# Patient Record
Sex: Female | Born: 1950
Health system: Southern US, Community
[De-identification: ages and names within clinical notes are randomized; demographics above are authoritative.]

## PROBLEM LIST (undated history)

## (undated) HISTORY — PX: NOSE SURGERY: SHX723

## (undated) HISTORY — PX: ABDOMINAL HYSTERECTOMY: SHX81

## (undated) HISTORY — PX: CHOLECYSTECTOMY: SHX55

---

## 2001-03-13 ENCOUNTER — Encounter: Payer: Self-pay | Admitting: Family Medicine

## 2001-03-13 ENCOUNTER — Encounter: Admission: RE | Admit: 2001-03-13 | Discharge: 2001-03-13 | Payer: Self-pay | Admitting: *Deleted

## 2007-09-14 ENCOUNTER — Ambulatory Visit: Payer: Self-pay | Admitting: Vascular Surgery

## 2010-10-05 NOTE — Consult Note (Signed)
NEW PATIENT CONSULTATION   Diane Lawrence, Diane Lawrence  DOB:  April 25, 1951                                       09/14/2007  UEAVW#:[id number]   The patient presents today for evaluation of left neck pain.  She is a  pleasant 60 year old white female with a long history of concern  regarding pain in her left neck.  She reports this can be throbbing and  reports a drawing and a twitching sensation in her left neck as well.  She has not had any prior focal neurologic deficits, specifically no  amaurosis fugax, transient ischemic attack, or stroke.  She does report  that, occasionally, she has a drawing sensation in her left hand.  She  was told 10 years ago that she had coronary artery disease and has been  on aspirin and Plavix since that time.  She has had several ultrasound  evaluations of her carotids showing no evidence of stenosis in the past.  She does have a history of stroke and the family is quite concerned  regarding this.  She is concerned that her neck pain may be a sign for  carotid disease and risk for stroke.  She does have a recent history of  cholecystectomy 5 weeks ago and she is recovering from this.  She also  has a history of reflux.   SOCIAL HISTORY:  She is single.  She does not smoke, having quit 1 year  ago.  Does not drink alcohol on a regular basis.  She denies history of  premature atherosclerotic disease in her parents.   REVIEW OF SYSTEMS:  Positive for weight loss, shortness of breath, heart  murmur, bronchitis, asthma, hiatal hernia, diarrhea following her  cholecystectomy, headache, arthritic joint pain, depression, and  nervousness.   ALLERGIES:  Codeine, aspirin, prednisone, and Cipro.   MEDICATIONS:  Plavix, aspirin, NitroQuick, lorazepam as needed, Nexium,  and Xopenex.   PHYSICAL EXAM:  Well-developed, well-nourished white female appearing  stated age of 20.  Blood pressure is 117/77, pulse 70, respirations 16.  She is grossly  intact neurologically.  She has 2+ radial and 2+ dorsalis  pedis pulses bilaterally.  Heart is regular rate and rhythm.  I do not  appreciate a murmur.  Her chest has some mild wheezing on inspiration.  Her carotid arteries are without bruits bilaterally.  I do not detect  any masses or any other abnormality in her right or left neck.  She  underwent repeat carotid duplex today and this confirms no evidence of  carotid stenoses and antegrade vertebral flow bilaterally.  I discussed  this at length with the patient, explaining that we would not anticipate  any neck pain as a sign for carotid disease.  I explained that the main  concern about carotid disease is it would be asymptomatic until there  was a neurologic event.  I reassured her that she does not have any  significant stenoses bilaterally and would not put her at any increased  risk for stroke from a carotid standpoint.  She was reassured with this  discussion and will see [place] again on an as needed basis.   Larina Earthly, M.D.  Electronically Signed   TFE/MEDQ  D:  09/14/2007  T:  09/17/2007  Job:  [id number]   cc:   Dr. Burnett Kanaris

## 2010-10-05 NOTE — Procedures (Signed)
CAROTID DUPLEX EXAM   INDICATION:  Neck pain.   HISTORY:  Diabetes:  No.  Cardiac:  Coronary artery disease, MI years ago per the patient.  Hypertension:  No.  Smoking:  No.  Quit 1 year ago.  Previous Surgery:  No.  CV History:  Amaurosis Fugax No, Paresthesias No, Hemiparesis No  The patient complains of several episodes of the 3 fingers on her left  hand drawing.                                       RIGHT             LEFT  Brachial systolic pressure:         110               100  Brachial Doppler waveforms:         Biphasic          Biphasic  Vertebral direction of flow:        Antegrade         Antegrade  DUPLEX VELOCITIES (cm/sec)  CCA peak systolic                   102               113  ECA peak systolic                   96                99  ICA peak systolic                   83                102  ICA end diastolic                   26                43  PLAQUE MORPHOLOGY:                  None              None  PLAQUE AMOUNT:                      None              None  PLAQUE LOCATION:                    None              None   IMPRESSION:  No evidence of ICA stenosis bilaterally.   ___________________________________________  Larina Earthly, M.D.   DP/MEDQ  D:  09/14/2007  T:  09/14/2007  Job:  [id number]

## 2011-05-31 DIAGNOSIS — S239XXA Sprain of unspecified parts of thorax, initial encounter: Secondary | ICD-10-CM | POA: Diagnosis not present

## 2011-06-01 DIAGNOSIS — R1084 Generalized abdominal pain: Secondary | ICD-10-CM | POA: Diagnosis not present

## 2011-06-01 DIAGNOSIS — R109 Unspecified abdominal pain: Secondary | ICD-10-CM | POA: Diagnosis not present

## 2011-06-03 DIAGNOSIS — R109 Unspecified abdominal pain: Secondary | ICD-10-CM | POA: Diagnosis not present

## 2011-06-06 DIAGNOSIS — M19019 Primary osteoarthritis, unspecified shoulder: Secondary | ICD-10-CM | POA: Diagnosis not present

## 2011-06-06 DIAGNOSIS — IMO0001 Reserved for inherently not codable concepts without codable children: Secondary | ICD-10-CM | POA: Diagnosis not present

## 2011-06-06 DIAGNOSIS — F411 Generalized anxiety disorder: Secondary | ICD-10-CM | POA: Diagnosis not present

## 2011-06-08 DIAGNOSIS — R079 Chest pain, unspecified: Secondary | ICD-10-CM | POA: Diagnosis not present

## 2011-06-08 DIAGNOSIS — I10 Essential (primary) hypertension: Secondary | ICD-10-CM | POA: Diagnosis not present

## 2011-06-08 DIAGNOSIS — R0602 Shortness of breath: Secondary | ICD-10-CM | POA: Diagnosis not present

## 2011-06-08 DIAGNOSIS — R002 Palpitations: Secondary | ICD-10-CM | POA: Diagnosis not present

## 2011-06-13 DIAGNOSIS — K219 Gastro-esophageal reflux disease without esophagitis: Secondary | ICD-10-CM | POA: Diagnosis not present

## 2011-06-13 DIAGNOSIS — R002 Palpitations: Secondary | ICD-10-CM | POA: Diagnosis not present

## 2011-06-21 DIAGNOSIS — R002 Palpitations: Secondary | ICD-10-CM | POA: Diagnosis not present

## 2011-06-23 DIAGNOSIS — K589 Irritable bowel syndrome without diarrhea: Secondary | ICD-10-CM | POA: Diagnosis not present

## 2011-07-06 DIAGNOSIS — D1739 Benign lipomatous neoplasm of skin and subcutaneous tissue of other sites: Secondary | ICD-10-CM | POA: Diagnosis not present

## 2011-07-07 DIAGNOSIS — J309 Allergic rhinitis, unspecified: Secondary | ICD-10-CM | POA: Diagnosis not present

## 2011-07-07 DIAGNOSIS — F411 Generalized anxiety disorder: Secondary | ICD-10-CM | POA: Diagnosis not present

## 2011-07-07 DIAGNOSIS — G4730 Sleep apnea, unspecified: Secondary | ICD-10-CM | POA: Diagnosis not present

## 2011-07-07 DIAGNOSIS — R0609 Other forms of dyspnea: Secondary | ICD-10-CM | POA: Diagnosis not present

## 2011-07-08 DIAGNOSIS — K7689 Other specified diseases of liver: Secondary | ICD-10-CM | POA: Diagnosis not present

## 2011-07-08 DIAGNOSIS — R1011 Right upper quadrant pain: Secondary | ICD-10-CM | POA: Diagnosis not present

## 2011-07-08 DIAGNOSIS — N289 Disorder of kidney and ureter, unspecified: Secondary | ICD-10-CM | POA: Diagnosis not present

## 2011-07-08 DIAGNOSIS — R1032 Left lower quadrant pain: Secondary | ICD-10-CM | POA: Diagnosis not present

## 2011-07-21 DIAGNOSIS — R002 Palpitations: Secondary | ICD-10-CM | POA: Diagnosis not present

## 2011-08-12 DIAGNOSIS — Z86010 Personal history of colonic polyps: Secondary | ICD-10-CM | POA: Diagnosis not present

## 2011-08-12 DIAGNOSIS — K921 Melena: Secondary | ICD-10-CM | POA: Diagnosis not present

## 2011-09-06 DIAGNOSIS — K921 Melena: Secondary | ICD-10-CM | POA: Diagnosis not present

## 2011-09-06 DIAGNOSIS — K644 Residual hemorrhoidal skin tags: Secondary | ICD-10-CM | POA: Diagnosis not present

## 2011-09-20 DIAGNOSIS — I10 Essential (primary) hypertension: Secondary | ICD-10-CM | POA: Diagnosis not present

## 2011-09-20 DIAGNOSIS — R002 Palpitations: Secondary | ICD-10-CM | POA: Diagnosis not present

## 2011-09-22 DIAGNOSIS — K219 Gastro-esophageal reflux disease without esophagitis: Secondary | ICD-10-CM | POA: Diagnosis not present

## 2011-09-22 DIAGNOSIS — K449 Diaphragmatic hernia without obstruction or gangrene: Secondary | ICD-10-CM | POA: Diagnosis not present

## 2011-09-22 DIAGNOSIS — K644 Residual hemorrhoidal skin tags: Secondary | ICD-10-CM | POA: Diagnosis not present

## 2011-09-22 DIAGNOSIS — K589 Irritable bowel syndrome without diarrhea: Secondary | ICD-10-CM | POA: Diagnosis not present

## 2011-09-28 DIAGNOSIS — R7301 Impaired fasting glucose: Secondary | ICD-10-CM | POA: Diagnosis not present

## 2011-09-28 DIAGNOSIS — Z833 Family history of diabetes mellitus: Secondary | ICD-10-CM | POA: Diagnosis not present

## 2011-09-28 DIAGNOSIS — R300 Dysuria: Secondary | ICD-10-CM | POA: Diagnosis not present

## 2011-09-28 DIAGNOSIS — M542 Cervicalgia: Secondary | ICD-10-CM | POA: Diagnosis not present

## 2011-10-05 DIAGNOSIS — J309 Allergic rhinitis, unspecified: Secondary | ICD-10-CM | POA: Diagnosis not present

## 2011-10-05 DIAGNOSIS — R0609 Other forms of dyspnea: Secondary | ICD-10-CM | POA: Diagnosis not present

## 2011-10-05 DIAGNOSIS — R0989 Other specified symptoms and signs involving the circulatory and respiratory systems: Secondary | ICD-10-CM | POA: Diagnosis not present

## 2011-10-05 DIAGNOSIS — G4710 Hypersomnia, unspecified: Secondary | ICD-10-CM | POA: Diagnosis not present

## 2011-10-05 DIAGNOSIS — F411 Generalized anxiety disorder: Secondary | ICD-10-CM | POA: Diagnosis not present

## 2011-10-05 DIAGNOSIS — G4730 Sleep apnea, unspecified: Secondary | ICD-10-CM | POA: Diagnosis not present

## 2011-10-07 DIAGNOSIS — K589 Irritable bowel syndrome without diarrhea: Secondary | ICD-10-CM | POA: Diagnosis not present

## 2011-10-07 DIAGNOSIS — F411 Generalized anxiety disorder: Secondary | ICD-10-CM | POA: Diagnosis not present

## 2011-10-07 DIAGNOSIS — I10 Essential (primary) hypertension: Secondary | ICD-10-CM | POA: Diagnosis not present

## 2011-10-07 DIAGNOSIS — G4710 Hypersomnia, unspecified: Secondary | ICD-10-CM | POA: Diagnosis not present

## 2011-10-07 DIAGNOSIS — K5730 Diverticulosis of large intestine without perforation or abscess without bleeding: Secondary | ICD-10-CM | POA: Diagnosis not present

## 2011-10-07 DIAGNOSIS — K449 Diaphragmatic hernia without obstruction or gangrene: Secondary | ICD-10-CM | POA: Diagnosis not present

## 2011-10-07 DIAGNOSIS — J45909 Unspecified asthma, uncomplicated: Secondary | ICD-10-CM | POA: Diagnosis not present

## 2011-10-07 DIAGNOSIS — G4730 Sleep apnea, unspecified: Secondary | ICD-10-CM | POA: Diagnosis not present

## 2011-10-07 DIAGNOSIS — K219 Gastro-esophageal reflux disease without esophagitis: Secondary | ICD-10-CM | POA: Diagnosis not present

## 2011-10-07 DIAGNOSIS — M6281 Muscle weakness (generalized): Secondary | ICD-10-CM | POA: Diagnosis not present

## 2011-10-08 DIAGNOSIS — K589 Irritable bowel syndrome without diarrhea: Secondary | ICD-10-CM | POA: Diagnosis not present

## 2011-10-08 DIAGNOSIS — I10 Essential (primary) hypertension: Secondary | ICD-10-CM | POA: Diagnosis not present

## 2011-10-08 DIAGNOSIS — G4710 Hypersomnia, unspecified: Secondary | ICD-10-CM | POA: Diagnosis not present

## 2011-10-08 DIAGNOSIS — K219 Gastro-esophageal reflux disease without esophagitis: Secondary | ICD-10-CM | POA: Diagnosis not present

## 2011-10-08 DIAGNOSIS — J45909 Unspecified asthma, uncomplicated: Secondary | ICD-10-CM | POA: Diagnosis not present

## 2011-10-08 DIAGNOSIS — K5730 Diverticulosis of large intestine without perforation or abscess without bleeding: Secondary | ICD-10-CM | POA: Diagnosis not present

## 2011-10-11 DIAGNOSIS — G4710 Hypersomnia, unspecified: Secondary | ICD-10-CM | POA: Diagnosis not present

## 2011-10-11 DIAGNOSIS — J45909 Unspecified asthma, uncomplicated: Secondary | ICD-10-CM | POA: Diagnosis not present

## 2011-10-11 DIAGNOSIS — K589 Irritable bowel syndrome without diarrhea: Secondary | ICD-10-CM | POA: Diagnosis not present

## 2011-10-11 DIAGNOSIS — I10 Essential (primary) hypertension: Secondary | ICD-10-CM | POA: Diagnosis not present

## 2011-10-11 DIAGNOSIS — K219 Gastro-esophageal reflux disease without esophagitis: Secondary | ICD-10-CM | POA: Diagnosis not present

## 2011-10-11 DIAGNOSIS — K5730 Diverticulosis of large intestine without perforation or abscess without bleeding: Secondary | ICD-10-CM | POA: Diagnosis not present

## 2011-10-13 DIAGNOSIS — K219 Gastro-esophageal reflux disease without esophagitis: Secondary | ICD-10-CM | POA: Diagnosis not present

## 2011-10-13 DIAGNOSIS — K589 Irritable bowel syndrome without diarrhea: Secondary | ICD-10-CM | POA: Diagnosis not present

## 2011-10-13 DIAGNOSIS — K5730 Diverticulosis of large intestine without perforation or abscess without bleeding: Secondary | ICD-10-CM | POA: Diagnosis not present

## 2011-10-13 DIAGNOSIS — I10 Essential (primary) hypertension: Secondary | ICD-10-CM | POA: Diagnosis not present

## 2011-10-13 DIAGNOSIS — J45909 Unspecified asthma, uncomplicated: Secondary | ICD-10-CM | POA: Diagnosis not present

## 2011-10-13 DIAGNOSIS — G4730 Sleep apnea, unspecified: Secondary | ICD-10-CM | POA: Diagnosis not present

## 2011-10-19 DIAGNOSIS — G4730 Sleep apnea, unspecified: Secondary | ICD-10-CM | POA: Diagnosis not present

## 2011-10-19 DIAGNOSIS — J45909 Unspecified asthma, uncomplicated: Secondary | ICD-10-CM | POA: Diagnosis not present

## 2011-10-19 DIAGNOSIS — K589 Irritable bowel syndrome without diarrhea: Secondary | ICD-10-CM | POA: Diagnosis not present

## 2011-10-19 DIAGNOSIS — I10 Essential (primary) hypertension: Secondary | ICD-10-CM | POA: Diagnosis not present

## 2011-10-19 DIAGNOSIS — K5730 Diverticulosis of large intestine without perforation or abscess without bleeding: Secondary | ICD-10-CM | POA: Diagnosis not present

## 2011-10-19 DIAGNOSIS — K219 Gastro-esophageal reflux disease without esophagitis: Secondary | ICD-10-CM | POA: Diagnosis not present

## 2011-10-26 DIAGNOSIS — I10 Essential (primary) hypertension: Secondary | ICD-10-CM | POA: Diagnosis not present

## 2011-10-26 DIAGNOSIS — J449 Chronic obstructive pulmonary disease, unspecified: Secondary | ICD-10-CM | POA: Diagnosis not present

## 2011-10-26 DIAGNOSIS — S81009A Unspecified open wound, unspecified knee, initial encounter: Secondary | ICD-10-CM | POA: Diagnosis not present

## 2011-10-26 DIAGNOSIS — R109 Unspecified abdominal pain: Secondary | ICD-10-CM | POA: Diagnosis not present

## 2011-10-26 DIAGNOSIS — M542 Cervicalgia: Secondary | ICD-10-CM | POA: Diagnosis not present

## 2011-10-26 DIAGNOSIS — S91009A Unspecified open wound, unspecified ankle, initial encounter: Secondary | ICD-10-CM | POA: Diagnosis not present

## 2011-10-27 DIAGNOSIS — K5730 Diverticulosis of large intestine without perforation or abscess without bleeding: Secondary | ICD-10-CM | POA: Diagnosis not present

## 2011-10-27 DIAGNOSIS — K219 Gastro-esophageal reflux disease without esophagitis: Secondary | ICD-10-CM | POA: Diagnosis not present

## 2011-10-27 DIAGNOSIS — J45909 Unspecified asthma, uncomplicated: Secondary | ICD-10-CM | POA: Diagnosis not present

## 2011-10-27 DIAGNOSIS — K589 Irritable bowel syndrome without diarrhea: Secondary | ICD-10-CM | POA: Diagnosis not present

## 2011-10-27 DIAGNOSIS — I10 Essential (primary) hypertension: Secondary | ICD-10-CM | POA: Diagnosis not present

## 2011-10-27 DIAGNOSIS — G4710 Hypersomnia, unspecified: Secondary | ICD-10-CM | POA: Diagnosis not present

## 2011-10-28 DIAGNOSIS — J209 Acute bronchitis, unspecified: Secondary | ICD-10-CM | POA: Diagnosis not present

## 2011-10-28 DIAGNOSIS — J019 Acute sinusitis, unspecified: Secondary | ICD-10-CM | POA: Diagnosis not present

## 2011-10-28 DIAGNOSIS — J029 Acute pharyngitis, unspecified: Secondary | ICD-10-CM | POA: Diagnosis not present

## 2011-11-01 DIAGNOSIS — G4730 Sleep apnea, unspecified: Secondary | ICD-10-CM | POA: Diagnosis not present

## 2011-11-01 DIAGNOSIS — G4710 Hypersomnia, unspecified: Secondary | ICD-10-CM | POA: Diagnosis not present

## 2011-11-01 DIAGNOSIS — R0989 Other specified symptoms and signs involving the circulatory and respiratory systems: Secondary | ICD-10-CM | POA: Diagnosis not present

## 2011-11-01 DIAGNOSIS — R0609 Other forms of dyspnea: Secondary | ICD-10-CM | POA: Diagnosis not present

## 2011-11-01 DIAGNOSIS — F411 Generalized anxiety disorder: Secondary | ICD-10-CM | POA: Diagnosis not present

## 2011-11-02 DIAGNOSIS — K589 Irritable bowel syndrome without diarrhea: Secondary | ICD-10-CM | POA: Diagnosis not present

## 2011-11-02 DIAGNOSIS — K5730 Diverticulosis of large intestine without perforation or abscess without bleeding: Secondary | ICD-10-CM | POA: Diagnosis not present

## 2011-11-02 DIAGNOSIS — I10 Essential (primary) hypertension: Secondary | ICD-10-CM | POA: Diagnosis not present

## 2011-11-02 DIAGNOSIS — J45909 Unspecified asthma, uncomplicated: Secondary | ICD-10-CM | POA: Diagnosis not present

## 2011-11-02 DIAGNOSIS — K219 Gastro-esophageal reflux disease without esophagitis: Secondary | ICD-10-CM | POA: Diagnosis not present

## 2011-11-02 DIAGNOSIS — G4710 Hypersomnia, unspecified: Secondary | ICD-10-CM | POA: Diagnosis not present

## 2011-11-07 DIAGNOSIS — M25569 Pain in unspecified knee: Secondary | ICD-10-CM | POA: Diagnosis not present

## 2011-11-07 DIAGNOSIS — M25539 Pain in unspecified wrist: Secondary | ICD-10-CM | POA: Diagnosis not present

## 2011-11-07 DIAGNOSIS — W1809XA Striking against other object with subsequent fall, initial encounter: Secondary | ICD-10-CM | POA: Diagnosis not present

## 2011-11-07 DIAGNOSIS — R51 Headache: Secondary | ICD-10-CM | POA: Diagnosis not present

## 2011-11-09 DIAGNOSIS — F411 Generalized anxiety disorder: Secondary | ICD-10-CM | POA: Diagnosis not present

## 2011-11-09 DIAGNOSIS — R599 Enlarged lymph nodes, unspecified: Secondary | ICD-10-CM | POA: Diagnosis not present

## 2011-11-09 DIAGNOSIS — R0989 Other specified symptoms and signs involving the circulatory and respiratory systems: Secondary | ICD-10-CM | POA: Diagnosis not present

## 2011-11-09 DIAGNOSIS — R0609 Other forms of dyspnea: Secondary | ICD-10-CM | POA: Diagnosis not present

## 2011-11-09 DIAGNOSIS — G4730 Sleep apnea, unspecified: Secondary | ICD-10-CM | POA: Diagnosis not present

## 2011-11-09 DIAGNOSIS — G4710 Hypersomnia, unspecified: Secondary | ICD-10-CM | POA: Diagnosis not present

## 2011-11-10 DIAGNOSIS — K5730 Diverticulosis of large intestine without perforation or abscess without bleeding: Secondary | ICD-10-CM | POA: Diagnosis not present

## 2011-11-10 DIAGNOSIS — I10 Essential (primary) hypertension: Secondary | ICD-10-CM | POA: Diagnosis not present

## 2011-11-10 DIAGNOSIS — K589 Irritable bowel syndrome without diarrhea: Secondary | ICD-10-CM | POA: Diagnosis not present

## 2011-11-10 DIAGNOSIS — K219 Gastro-esophageal reflux disease without esophagitis: Secondary | ICD-10-CM | POA: Diagnosis not present

## 2011-11-10 DIAGNOSIS — G4730 Sleep apnea, unspecified: Secondary | ICD-10-CM | POA: Diagnosis not present

## 2011-11-10 DIAGNOSIS — J45909 Unspecified asthma, uncomplicated: Secondary | ICD-10-CM | POA: Diagnosis not present

## 2011-11-14 DIAGNOSIS — G4710 Hypersomnia, unspecified: Secondary | ICD-10-CM | POA: Diagnosis not present

## 2011-11-14 DIAGNOSIS — G4730 Sleep apnea, unspecified: Secondary | ICD-10-CM | POA: Diagnosis not present

## 2011-11-15 DIAGNOSIS — I10 Essential (primary) hypertension: Secondary | ICD-10-CM | POA: Diagnosis not present

## 2011-11-15 DIAGNOSIS — G4730 Sleep apnea, unspecified: Secondary | ICD-10-CM | POA: Diagnosis not present

## 2011-11-16 DIAGNOSIS — K589 Irritable bowel syndrome without diarrhea: Secondary | ICD-10-CM | POA: Diagnosis not present

## 2011-11-16 DIAGNOSIS — J45909 Unspecified asthma, uncomplicated: Secondary | ICD-10-CM | POA: Diagnosis not present

## 2011-11-16 DIAGNOSIS — I10 Essential (primary) hypertension: Secondary | ICD-10-CM | POA: Diagnosis not present

## 2011-11-16 DIAGNOSIS — G4710 Hypersomnia, unspecified: Secondary | ICD-10-CM | POA: Diagnosis not present

## 2011-11-16 DIAGNOSIS — K5730 Diverticulosis of large intestine without perforation or abscess without bleeding: Secondary | ICD-10-CM | POA: Diagnosis not present

## 2011-11-16 DIAGNOSIS — K219 Gastro-esophageal reflux disease without esophagitis: Secondary | ICD-10-CM | POA: Diagnosis not present

## 2011-11-21 DIAGNOSIS — K219 Gastro-esophageal reflux disease without esophagitis: Secondary | ICD-10-CM | POA: Diagnosis not present

## 2011-11-21 DIAGNOSIS — G4730 Sleep apnea, unspecified: Secondary | ICD-10-CM | POA: Diagnosis not present

## 2011-11-21 DIAGNOSIS — I10 Essential (primary) hypertension: Secondary | ICD-10-CM | POA: Diagnosis not present

## 2011-11-21 DIAGNOSIS — K589 Irritable bowel syndrome without diarrhea: Secondary | ICD-10-CM | POA: Diagnosis not present

## 2011-11-21 DIAGNOSIS — K5730 Diverticulosis of large intestine without perforation or abscess without bleeding: Secondary | ICD-10-CM | POA: Diagnosis not present

## 2011-11-21 DIAGNOSIS — J45909 Unspecified asthma, uncomplicated: Secondary | ICD-10-CM | POA: Diagnosis not present

## 2011-11-28 DIAGNOSIS — D1739 Benign lipomatous neoplasm of skin and subcutaneous tissue of other sites: Secondary | ICD-10-CM | POA: Diagnosis not present

## 2011-11-30 DIAGNOSIS — R229 Localized swelling, mass and lump, unspecified: Secondary | ICD-10-CM | POA: Diagnosis not present

## 2011-11-30 DIAGNOSIS — D1739 Benign lipomatous neoplasm of skin and subcutaneous tissue of other sites: Secondary | ICD-10-CM | POA: Diagnosis not present

## 2011-12-01 DIAGNOSIS — G4710 Hypersomnia, unspecified: Secondary | ICD-10-CM | POA: Diagnosis not present

## 2011-12-01 DIAGNOSIS — J45909 Unspecified asthma, uncomplicated: Secondary | ICD-10-CM | POA: Diagnosis not present

## 2011-12-01 DIAGNOSIS — K219 Gastro-esophageal reflux disease without esophagitis: Secondary | ICD-10-CM | POA: Diagnosis not present

## 2011-12-01 DIAGNOSIS — K5730 Diverticulosis of large intestine without perforation or abscess without bleeding: Secondary | ICD-10-CM | POA: Diagnosis not present

## 2011-12-01 DIAGNOSIS — K589 Irritable bowel syndrome without diarrhea: Secondary | ICD-10-CM | POA: Diagnosis not present

## 2011-12-01 DIAGNOSIS — I10 Essential (primary) hypertension: Secondary | ICD-10-CM | POA: Diagnosis not present

## 2011-12-05 DIAGNOSIS — D1739 Benign lipomatous neoplasm of skin and subcutaneous tissue of other sites: Secondary | ICD-10-CM | POA: Diagnosis not present

## 2011-12-06 DIAGNOSIS — J45909 Unspecified asthma, uncomplicated: Secondary | ICD-10-CM | POA: Diagnosis not present

## 2011-12-06 DIAGNOSIS — I10 Essential (primary) hypertension: Secondary | ICD-10-CM | POA: Diagnosis not present

## 2011-12-06 DIAGNOSIS — F411 Generalized anxiety disorder: Secondary | ICD-10-CM | POA: Diagnosis not present

## 2011-12-06 DIAGNOSIS — K219 Gastro-esophageal reflux disease without esophagitis: Secondary | ICD-10-CM | POA: Diagnosis not present

## 2011-12-06 DIAGNOSIS — Z9981 Dependence on supplemental oxygen: Secondary | ICD-10-CM | POA: Diagnosis not present

## 2011-12-06 DIAGNOSIS — K589 Irritable bowel syndrome without diarrhea: Secondary | ICD-10-CM | POA: Diagnosis not present

## 2011-12-06 DIAGNOSIS — M2550 Pain in unspecified joint: Secondary | ICD-10-CM | POA: Diagnosis not present

## 2011-12-06 DIAGNOSIS — G4730 Sleep apnea, unspecified: Secondary | ICD-10-CM | POA: Diagnosis not present

## 2011-12-06 DIAGNOSIS — K5730 Diverticulosis of large intestine without perforation or abscess without bleeding: Secondary | ICD-10-CM | POA: Diagnosis not present

## 2011-12-08 DIAGNOSIS — I10 Essential (primary) hypertension: Secondary | ICD-10-CM | POA: Diagnosis not present

## 2011-12-08 DIAGNOSIS — K5730 Diverticulosis of large intestine without perforation or abscess without bleeding: Secondary | ICD-10-CM | POA: Diagnosis not present

## 2011-12-08 DIAGNOSIS — K219 Gastro-esophageal reflux disease without esophagitis: Secondary | ICD-10-CM | POA: Diagnosis not present

## 2011-12-08 DIAGNOSIS — J45909 Unspecified asthma, uncomplicated: Secondary | ICD-10-CM | POA: Diagnosis not present

## 2011-12-08 DIAGNOSIS — K589 Irritable bowel syndrome without diarrhea: Secondary | ICD-10-CM | POA: Diagnosis not present

## 2011-12-08 DIAGNOSIS — M2550 Pain in unspecified joint: Secondary | ICD-10-CM | POA: Diagnosis not present

## 2011-12-09 DIAGNOSIS — R0609 Other forms of dyspnea: Secondary | ICD-10-CM | POA: Diagnosis not present

## 2011-12-09 DIAGNOSIS — G2581 Restless legs syndrome: Secondary | ICD-10-CM | POA: Diagnosis not present

## 2011-12-09 DIAGNOSIS — G4730 Sleep apnea, unspecified: Secondary | ICD-10-CM | POA: Diagnosis not present

## 2011-12-09 DIAGNOSIS — R0989 Other specified symptoms and signs involving the circulatory and respiratory systems: Secondary | ICD-10-CM | POA: Diagnosis not present

## 2011-12-09 DIAGNOSIS — F411 Generalized anxiety disorder: Secondary | ICD-10-CM | POA: Diagnosis not present

## 2011-12-09 DIAGNOSIS — G4710 Hypersomnia, unspecified: Secondary | ICD-10-CM | POA: Diagnosis not present

## 2011-12-12 DIAGNOSIS — G4710 Hypersomnia, unspecified: Secondary | ICD-10-CM | POA: Diagnosis not present

## 2011-12-12 DIAGNOSIS — G4730 Sleep apnea, unspecified: Secondary | ICD-10-CM | POA: Diagnosis not present

## 2011-12-14 DIAGNOSIS — M2550 Pain in unspecified joint: Secondary | ICD-10-CM | POA: Diagnosis not present

## 2011-12-14 DIAGNOSIS — K5730 Diverticulosis of large intestine without perforation or abscess without bleeding: Secondary | ICD-10-CM | POA: Diagnosis not present

## 2011-12-14 DIAGNOSIS — K219 Gastro-esophageal reflux disease without esophagitis: Secondary | ICD-10-CM | POA: Diagnosis not present

## 2011-12-14 DIAGNOSIS — K589 Irritable bowel syndrome without diarrhea: Secondary | ICD-10-CM | POA: Diagnosis not present

## 2011-12-14 DIAGNOSIS — I10 Essential (primary) hypertension: Secondary | ICD-10-CM | POA: Diagnosis not present

## 2011-12-14 DIAGNOSIS — J45909 Unspecified asthma, uncomplicated: Secondary | ICD-10-CM | POA: Diagnosis not present

## 2011-12-15 DIAGNOSIS — K5730 Diverticulosis of large intestine without perforation or abscess without bleeding: Secondary | ICD-10-CM | POA: Diagnosis not present

## 2011-12-21 DIAGNOSIS — K5730 Diverticulosis of large intestine without perforation or abscess without bleeding: Secondary | ICD-10-CM | POA: Diagnosis not present

## 2011-12-21 DIAGNOSIS — K589 Irritable bowel syndrome without diarrhea: Secondary | ICD-10-CM | POA: Diagnosis not present

## 2011-12-21 DIAGNOSIS — M2550 Pain in unspecified joint: Secondary | ICD-10-CM | POA: Diagnosis not present

## 2011-12-21 DIAGNOSIS — I10 Essential (primary) hypertension: Secondary | ICD-10-CM | POA: Diagnosis not present

## 2011-12-21 DIAGNOSIS — J45909 Unspecified asthma, uncomplicated: Secondary | ICD-10-CM | POA: Diagnosis not present

## 2011-12-21 DIAGNOSIS — K219 Gastro-esophageal reflux disease without esophagitis: Secondary | ICD-10-CM | POA: Diagnosis not present

## 2011-12-26 DIAGNOSIS — K219 Gastro-esophageal reflux disease without esophagitis: Secondary | ICD-10-CM | POA: Diagnosis not present

## 2011-12-26 DIAGNOSIS — K5730 Diverticulosis of large intestine without perforation or abscess without bleeding: Secondary | ICD-10-CM | POA: Diagnosis not present

## 2011-12-26 DIAGNOSIS — I10 Essential (primary) hypertension: Secondary | ICD-10-CM | POA: Diagnosis not present

## 2011-12-26 DIAGNOSIS — M2550 Pain in unspecified joint: Secondary | ICD-10-CM | POA: Diagnosis not present

## 2011-12-26 DIAGNOSIS — K589 Irritable bowel syndrome without diarrhea: Secondary | ICD-10-CM | POA: Diagnosis not present

## 2011-12-26 DIAGNOSIS — J45909 Unspecified asthma, uncomplicated: Secondary | ICD-10-CM | POA: Diagnosis not present

## 2011-12-27 DIAGNOSIS — K589 Irritable bowel syndrome without diarrhea: Secondary | ICD-10-CM | POA: Diagnosis not present

## 2011-12-27 DIAGNOSIS — G894 Chronic pain syndrome: Secondary | ICD-10-CM | POA: Diagnosis not present

## 2011-12-27 DIAGNOSIS — G4730 Sleep apnea, unspecified: Secondary | ICD-10-CM | POA: Diagnosis not present

## 2011-12-30 DIAGNOSIS — Z136 Encounter for screening for cardiovascular disorders: Secondary | ICD-10-CM | POA: Diagnosis not present

## 2012-01-02 DIAGNOSIS — M2550 Pain in unspecified joint: Secondary | ICD-10-CM | POA: Diagnosis not present

## 2012-01-02 DIAGNOSIS — J45909 Unspecified asthma, uncomplicated: Secondary | ICD-10-CM | POA: Diagnosis not present

## 2012-01-02 DIAGNOSIS — I10 Essential (primary) hypertension: Secondary | ICD-10-CM | POA: Diagnosis not present

## 2012-01-02 DIAGNOSIS — K219 Gastro-esophageal reflux disease without esophagitis: Secondary | ICD-10-CM | POA: Diagnosis not present

## 2012-01-02 DIAGNOSIS — K5730 Diverticulosis of large intestine without perforation or abscess without bleeding: Secondary | ICD-10-CM | POA: Diagnosis not present

## 2012-01-02 DIAGNOSIS — K589 Irritable bowel syndrome without diarrhea: Secondary | ICD-10-CM | POA: Diagnosis not present

## 2012-01-09 DIAGNOSIS — G4730 Sleep apnea, unspecified: Secondary | ICD-10-CM | POA: Diagnosis not present

## 2012-01-11 DIAGNOSIS — K589 Irritable bowel syndrome without diarrhea: Secondary | ICD-10-CM | POA: Diagnosis not present

## 2012-01-11 DIAGNOSIS — I10 Essential (primary) hypertension: Secondary | ICD-10-CM | POA: Diagnosis not present

## 2012-01-11 DIAGNOSIS — K219 Gastro-esophageal reflux disease without esophagitis: Secondary | ICD-10-CM | POA: Diagnosis not present

## 2012-01-11 DIAGNOSIS — K5730 Diverticulosis of large intestine without perforation or abscess without bleeding: Secondary | ICD-10-CM | POA: Diagnosis not present

## 2012-01-11 DIAGNOSIS — J45909 Unspecified asthma, uncomplicated: Secondary | ICD-10-CM | POA: Diagnosis not present

## 2012-01-11 DIAGNOSIS — M2550 Pain in unspecified joint: Secondary | ICD-10-CM | POA: Diagnosis not present

## 2012-01-16 DIAGNOSIS — G4730 Sleep apnea, unspecified: Secondary | ICD-10-CM | POA: Diagnosis not present

## 2012-01-16 DIAGNOSIS — R0609 Other forms of dyspnea: Secondary | ICD-10-CM | POA: Diagnosis not present

## 2012-01-16 DIAGNOSIS — F411 Generalized anxiety disorder: Secondary | ICD-10-CM | POA: Diagnosis not present

## 2012-01-16 DIAGNOSIS — G2581 Restless legs syndrome: Secondary | ICD-10-CM | POA: Diagnosis not present

## 2012-01-16 DIAGNOSIS — R0989 Other specified symptoms and signs involving the circulatory and respiratory systems: Secondary | ICD-10-CM | POA: Diagnosis not present

## 2012-01-16 DIAGNOSIS — G4710 Hypersomnia, unspecified: Secondary | ICD-10-CM | POA: Diagnosis not present

## 2012-01-18 DIAGNOSIS — J45909 Unspecified asthma, uncomplicated: Secondary | ICD-10-CM | POA: Diagnosis not present

## 2012-01-18 DIAGNOSIS — K5730 Diverticulosis of large intestine without perforation or abscess without bleeding: Secondary | ICD-10-CM | POA: Diagnosis not present

## 2012-01-18 DIAGNOSIS — K219 Gastro-esophageal reflux disease without esophagitis: Secondary | ICD-10-CM | POA: Diagnosis not present

## 2012-01-18 DIAGNOSIS — K589 Irritable bowel syndrome without diarrhea: Secondary | ICD-10-CM | POA: Diagnosis not present

## 2012-01-18 DIAGNOSIS — M2550 Pain in unspecified joint: Secondary | ICD-10-CM | POA: Diagnosis not present

## 2012-01-18 DIAGNOSIS — I10 Essential (primary) hypertension: Secondary | ICD-10-CM | POA: Diagnosis not present

## 2012-01-25 DIAGNOSIS — M2550 Pain in unspecified joint: Secondary | ICD-10-CM | POA: Diagnosis not present

## 2012-01-25 DIAGNOSIS — K219 Gastro-esophageal reflux disease without esophagitis: Secondary | ICD-10-CM | POA: Diagnosis not present

## 2012-01-25 DIAGNOSIS — K589 Irritable bowel syndrome without diarrhea: Secondary | ICD-10-CM | POA: Diagnosis not present

## 2012-01-25 DIAGNOSIS — I10 Essential (primary) hypertension: Secondary | ICD-10-CM | POA: Diagnosis not present

## 2012-01-25 DIAGNOSIS — J45909 Unspecified asthma, uncomplicated: Secondary | ICD-10-CM | POA: Diagnosis not present

## 2012-01-25 DIAGNOSIS — K5730 Diverticulosis of large intestine without perforation or abscess without bleeding: Secondary | ICD-10-CM | POA: Diagnosis not present

## 2012-01-30 DIAGNOSIS — I10 Essential (primary) hypertension: Secondary | ICD-10-CM | POA: Diagnosis not present

## 2012-01-30 DIAGNOSIS — J45909 Unspecified asthma, uncomplicated: Secondary | ICD-10-CM | POA: Diagnosis not present

## 2012-01-30 DIAGNOSIS — K5730 Diverticulosis of large intestine without perforation or abscess without bleeding: Secondary | ICD-10-CM | POA: Diagnosis not present

## 2012-01-30 DIAGNOSIS — M2550 Pain in unspecified joint: Secondary | ICD-10-CM | POA: Diagnosis not present

## 2012-01-30 DIAGNOSIS — K219 Gastro-esophageal reflux disease without esophagitis: Secondary | ICD-10-CM | POA: Diagnosis not present

## 2012-01-30 DIAGNOSIS — K589 Irritable bowel syndrome without diarrhea: Secondary | ICD-10-CM | POA: Diagnosis not present

## 2012-01-31 DIAGNOSIS — R0789 Other chest pain: Secondary | ICD-10-CM | POA: Diagnosis not present

## 2012-01-31 DIAGNOSIS — K469 Unspecified abdominal hernia without obstruction or gangrene: Secondary | ICD-10-CM | POA: Diagnosis not present

## 2012-02-28 DIAGNOSIS — M545 Low back pain: Secondary | ICD-10-CM | POA: Diagnosis not present

## 2012-02-28 DIAGNOSIS — M542 Cervicalgia: Secondary | ICD-10-CM | POA: Diagnosis not present

## 2012-03-01 DIAGNOSIS — M545 Low back pain: Secondary | ICD-10-CM | POA: Diagnosis not present

## 2012-03-01 DIAGNOSIS — M542 Cervicalgia: Secondary | ICD-10-CM | POA: Diagnosis not present

## 2012-03-06 DIAGNOSIS — M545 Low back pain: Secondary | ICD-10-CM | POA: Diagnosis not present

## 2012-03-06 DIAGNOSIS — M542 Cervicalgia: Secondary | ICD-10-CM | POA: Diagnosis not present

## 2012-03-07 DIAGNOSIS — I10 Essential (primary) hypertension: Secondary | ICD-10-CM | POA: Diagnosis not present

## 2012-03-07 DIAGNOSIS — G4730 Sleep apnea, unspecified: Secondary | ICD-10-CM | POA: Diagnosis not present

## 2012-03-07 DIAGNOSIS — R002 Palpitations: Secondary | ICD-10-CM | POA: Diagnosis not present

## 2012-03-13 DIAGNOSIS — M545 Low back pain: Secondary | ICD-10-CM | POA: Diagnosis not present

## 2012-03-13 DIAGNOSIS — M542 Cervicalgia: Secondary | ICD-10-CM | POA: Diagnosis not present

## 2012-03-15 DIAGNOSIS — M542 Cervicalgia: Secondary | ICD-10-CM | POA: Diagnosis not present

## 2012-03-15 DIAGNOSIS — M545 Low back pain: Secondary | ICD-10-CM | POA: Diagnosis not present

## 2012-03-21 DIAGNOSIS — R002 Palpitations: Secondary | ICD-10-CM | POA: Diagnosis not present

## 2012-03-27 DIAGNOSIS — J329 Chronic sinusitis, unspecified: Secondary | ICD-10-CM | POA: Diagnosis not present

## 2012-03-27 DIAGNOSIS — H6690 Otitis media, unspecified, unspecified ear: Secondary | ICD-10-CM | POA: Diagnosis not present

## 2012-03-27 DIAGNOSIS — J029 Acute pharyngitis, unspecified: Secondary | ICD-10-CM | POA: Diagnosis not present

## 2012-04-09 DIAGNOSIS — S0990XA Unspecified injury of head, initial encounter: Secondary | ICD-10-CM | POA: Diagnosis not present

## 2012-04-09 DIAGNOSIS — IMO0002 Reserved for concepts with insufficient information to code with codable children: Secondary | ICD-10-CM | POA: Diagnosis not present

## 2012-04-09 DIAGNOSIS — S1093XA Contusion of unspecified part of neck, initial encounter: Secondary | ICD-10-CM | POA: Diagnosis not present

## 2012-04-09 DIAGNOSIS — S0083XA Contusion of other part of head, initial encounter: Secondary | ICD-10-CM | POA: Diagnosis not present

## 2012-04-09 DIAGNOSIS — S0003XA Contusion of scalp, initial encounter: Secondary | ICD-10-CM | POA: Diagnosis not present

## 2012-04-12 DIAGNOSIS — R5381 Other malaise: Secondary | ICD-10-CM | POA: Diagnosis not present

## 2012-04-12 DIAGNOSIS — R110 Nausea: Secondary | ICD-10-CM | POA: Diagnosis not present

## 2012-04-12 DIAGNOSIS — R109 Unspecified abdominal pain: Secondary | ICD-10-CM | POA: Diagnosis not present

## 2012-04-13 DIAGNOSIS — R197 Diarrhea, unspecified: Secondary | ICD-10-CM | POA: Diagnosis not present

## 2012-04-13 DIAGNOSIS — R109 Unspecified abdominal pain: Secondary | ICD-10-CM | POA: Diagnosis not present

## 2012-04-13 DIAGNOSIS — K5730 Diverticulosis of large intestine without perforation or abscess without bleeding: Secondary | ICD-10-CM | POA: Diagnosis not present

## 2012-04-13 DIAGNOSIS — K769 Liver disease, unspecified: Secondary | ICD-10-CM | POA: Diagnosis not present

## 2012-04-13 DIAGNOSIS — R110 Nausea: Secondary | ICD-10-CM | POA: Diagnosis not present

## 2012-05-02 DIAGNOSIS — J3089 Other allergic rhinitis: Secondary | ICD-10-CM | POA: Diagnosis not present

## 2012-05-02 DIAGNOSIS — G4710 Hypersomnia, unspecified: Secondary | ICD-10-CM | POA: Diagnosis not present

## 2012-05-02 DIAGNOSIS — R0989 Other specified symptoms and signs involving the circulatory and respiratory systems: Secondary | ICD-10-CM | POA: Diagnosis not present

## 2012-05-02 DIAGNOSIS — R0609 Other forms of dyspnea: Secondary | ICD-10-CM | POA: Diagnosis not present

## 2012-05-02 DIAGNOSIS — G2581 Restless legs syndrome: Secondary | ICD-10-CM | POA: Diagnosis not present

## 2012-05-02 DIAGNOSIS — G4730 Sleep apnea, unspecified: Secondary | ICD-10-CM | POA: Diagnosis not present

## 2012-06-25 DIAGNOSIS — G4710 Hypersomnia, unspecified: Secondary | ICD-10-CM | POA: Diagnosis not present

## 2012-06-25 DIAGNOSIS — G2581 Restless legs syndrome: Secondary | ICD-10-CM | POA: Diagnosis not present

## 2012-06-25 DIAGNOSIS — R0609 Other forms of dyspnea: Secondary | ICD-10-CM | POA: Diagnosis not present

## 2012-06-25 DIAGNOSIS — I70219 Atherosclerosis of native arteries of extremities with intermittent claudication, unspecified extremity: Secondary | ICD-10-CM | POA: Diagnosis not present

## 2012-06-25 DIAGNOSIS — G4730 Sleep apnea, unspecified: Secondary | ICD-10-CM | POA: Diagnosis not present

## 2012-07-12 DIAGNOSIS — R51 Headache: Secondary | ICD-10-CM | POA: Diagnosis not present

## 2012-07-30 DIAGNOSIS — J019 Acute sinusitis, unspecified: Secondary | ICD-10-CM | POA: Diagnosis not present

## 2012-08-20 DIAGNOSIS — F411 Generalized anxiety disorder: Secondary | ICD-10-CM | POA: Diagnosis not present

## 2012-08-20 DIAGNOSIS — G4710 Hypersomnia, unspecified: Secondary | ICD-10-CM | POA: Diagnosis not present

## 2012-08-20 DIAGNOSIS — G2581 Restless legs syndrome: Secondary | ICD-10-CM | POA: Diagnosis not present

## 2012-08-20 DIAGNOSIS — R0609 Other forms of dyspnea: Secondary | ICD-10-CM | POA: Diagnosis not present

## 2012-08-20 DIAGNOSIS — R0989 Other specified symptoms and signs involving the circulatory and respiratory systems: Secondary | ICD-10-CM | POA: Diagnosis not present

## 2012-08-20 DIAGNOSIS — I70219 Atherosclerosis of native arteries of extremities with intermittent claudication, unspecified extremity: Secondary | ICD-10-CM | POA: Diagnosis not present

## 2012-08-20 DIAGNOSIS — Z006 Encounter for examination for normal comparison and control in clinical research program: Secondary | ICD-10-CM | POA: Diagnosis not present

## 2012-08-20 DIAGNOSIS — R5381 Other malaise: Secondary | ICD-10-CM | POA: Diagnosis not present

## 2012-09-24 DIAGNOSIS — R0989 Other specified symptoms and signs involving the circulatory and respiratory systems: Secondary | ICD-10-CM | POA: Diagnosis not present

## 2012-09-24 DIAGNOSIS — G4730 Sleep apnea, unspecified: Secondary | ICD-10-CM | POA: Diagnosis not present

## 2012-09-24 DIAGNOSIS — F411 Generalized anxiety disorder: Secondary | ICD-10-CM | POA: Diagnosis not present

## 2012-09-24 DIAGNOSIS — J3089 Other allergic rhinitis: Secondary | ICD-10-CM | POA: Diagnosis not present

## 2012-09-24 DIAGNOSIS — R0609 Other forms of dyspnea: Secondary | ICD-10-CM | POA: Diagnosis not present

## 2012-09-24 DIAGNOSIS — I70219 Atherosclerosis of native arteries of extremities with intermittent claudication, unspecified extremity: Secondary | ICD-10-CM | POA: Diagnosis not present

## 2012-09-24 DIAGNOSIS — G4710 Hypersomnia, unspecified: Secondary | ICD-10-CM | POA: Diagnosis not present

## 2012-09-24 DIAGNOSIS — G2581 Restless legs syndrome: Secondary | ICD-10-CM | POA: Diagnosis not present

## 2012-09-26 DIAGNOSIS — G4730 Sleep apnea, unspecified: Secondary | ICD-10-CM | POA: Diagnosis not present

## 2012-09-26 DIAGNOSIS — R002 Palpitations: Secondary | ICD-10-CM | POA: Diagnosis not present

## 2012-09-26 DIAGNOSIS — R0989 Other specified symptoms and signs involving the circulatory and respiratory systems: Secondary | ICD-10-CM | POA: Diagnosis not present

## 2012-09-26 DIAGNOSIS — I10 Essential (primary) hypertension: Secondary | ICD-10-CM | POA: Diagnosis not present

## 2012-09-28 DIAGNOSIS — G4730 Sleep apnea, unspecified: Secondary | ICD-10-CM | POA: Diagnosis not present

## 2012-09-28 DIAGNOSIS — G4710 Hypersomnia, unspecified: Secondary | ICD-10-CM | POA: Diagnosis not present

## 2012-10-03 DIAGNOSIS — R51 Headache: Secondary | ICD-10-CM | POA: Diagnosis not present

## 2012-10-08 DIAGNOSIS — R0989 Other specified symptoms and signs involving the circulatory and respiratory systems: Secondary | ICD-10-CM | POA: Diagnosis not present

## 2012-11-03 DIAGNOSIS — R42 Dizziness and giddiness: Secondary | ICD-10-CM | POA: Diagnosis not present

## 2012-11-03 DIAGNOSIS — S0990XA Unspecified injury of head, initial encounter: Secondary | ICD-10-CM | POA: Diagnosis not present

## 2012-11-03 DIAGNOSIS — I10 Essential (primary) hypertension: Secondary | ICD-10-CM | POA: Diagnosis not present

## 2012-11-03 DIAGNOSIS — S060X0A Concussion without loss of consciousness, initial encounter: Secondary | ICD-10-CM | POA: Diagnosis not present

## 2012-11-03 DIAGNOSIS — Z8673 Personal history of transient ischemic attack (TIA), and cerebral infarction without residual deficits: Secondary | ICD-10-CM | POA: Diagnosis not present

## 2012-11-03 DIAGNOSIS — W1809XA Striking against other object with subsequent fall, initial encounter: Secondary | ICD-10-CM | POA: Diagnosis not present

## 2012-11-03 DIAGNOSIS — K219 Gastro-esophageal reflux disease without esophagitis: Secondary | ICD-10-CM | POA: Diagnosis not present

## 2012-11-03 DIAGNOSIS — R51 Headache: Secondary | ICD-10-CM | POA: Diagnosis not present

## 2012-11-22 DIAGNOSIS — K219 Gastro-esophageal reflux disease without esophagitis: Secondary | ICD-10-CM | POA: Diagnosis not present

## 2013-01-08 DIAGNOSIS — G4710 Hypersomnia, unspecified: Secondary | ICD-10-CM | POA: Diagnosis not present

## 2013-01-08 DIAGNOSIS — F411 Generalized anxiety disorder: Secondary | ICD-10-CM | POA: Diagnosis not present

## 2013-01-08 DIAGNOSIS — R0609 Other forms of dyspnea: Secondary | ICD-10-CM | POA: Diagnosis not present

## 2013-01-08 DIAGNOSIS — G2581 Restless legs syndrome: Secondary | ICD-10-CM | POA: Diagnosis not present

## 2013-01-08 DIAGNOSIS — J309 Allergic rhinitis, unspecified: Secondary | ICD-10-CM | POA: Diagnosis not present

## 2013-01-09 DIAGNOSIS — G4710 Hypersomnia, unspecified: Secondary | ICD-10-CM | POA: Diagnosis not present

## 2013-03-01 DIAGNOSIS — K219 Gastro-esophageal reflux disease without esophagitis: Secondary | ICD-10-CM | POA: Diagnosis not present

## 2013-03-01 DIAGNOSIS — R079 Chest pain, unspecified: Secondary | ICD-10-CM | POA: Diagnosis not present

## 2013-05-08 DIAGNOSIS — IMO0002 Reserved for concepts with insufficient information to code with codable children: Secondary | ICD-10-CM | POA: Diagnosis not present

## 2013-06-06 DIAGNOSIS — R109 Unspecified abdominal pain: Secondary | ICD-10-CM | POA: Diagnosis not present

## 2013-07-15 DIAGNOSIS — R0609 Other forms of dyspnea: Secondary | ICD-10-CM | POA: Diagnosis not present

## 2013-07-15 DIAGNOSIS — G4710 Hypersomnia, unspecified: Secondary | ICD-10-CM | POA: Diagnosis not present

## 2013-07-15 DIAGNOSIS — G4730 Sleep apnea, unspecified: Secondary | ICD-10-CM | POA: Diagnosis not present

## 2013-07-15 DIAGNOSIS — F411 Generalized anxiety disorder: Secondary | ICD-10-CM | POA: Diagnosis not present

## 2013-07-15 DIAGNOSIS — R0989 Other specified symptoms and signs involving the circulatory and respiratory systems: Secondary | ICD-10-CM | POA: Diagnosis not present

## 2013-07-15 DIAGNOSIS — J309 Allergic rhinitis, unspecified: Secondary | ICD-10-CM | POA: Diagnosis not present

## 2013-08-05 DIAGNOSIS — G4730 Sleep apnea, unspecified: Secondary | ICD-10-CM | POA: Diagnosis not present

## 2013-08-05 DIAGNOSIS — R002 Palpitations: Secondary | ICD-10-CM | POA: Diagnosis not present

## 2013-08-05 DIAGNOSIS — F17200 Nicotine dependence, unspecified, uncomplicated: Secondary | ICD-10-CM | POA: Diagnosis not present

## 2013-08-14 DIAGNOSIS — R002 Palpitations: Secondary | ICD-10-CM | POA: Diagnosis not present

## 2013-08-27 DIAGNOSIS — R002 Palpitations: Secondary | ICD-10-CM | POA: Diagnosis not present

## 2013-10-02 DIAGNOSIS — J309 Allergic rhinitis, unspecified: Secondary | ICD-10-CM | POA: Diagnosis not present

## 2013-10-02 DIAGNOSIS — R0989 Other specified symptoms and signs involving the circulatory and respiratory systems: Secondary | ICD-10-CM | POA: Diagnosis not present

## 2013-10-02 DIAGNOSIS — G4710 Hypersomnia, unspecified: Secondary | ICD-10-CM | POA: Diagnosis not present

## 2013-10-02 DIAGNOSIS — G4730 Sleep apnea, unspecified: Secondary | ICD-10-CM | POA: Diagnosis not present

## 2013-10-02 DIAGNOSIS — R0609 Other forms of dyspnea: Secondary | ICD-10-CM | POA: Diagnosis not present

## 2013-10-02 DIAGNOSIS — F411 Generalized anxiety disorder: Secondary | ICD-10-CM | POA: Diagnosis not present

## 2013-10-17 DIAGNOSIS — M940 Chondrocostal junction syndrome [Tietze]: Secondary | ICD-10-CM | POA: Diagnosis not present

## 2013-10-17 DIAGNOSIS — R079 Chest pain, unspecified: Secondary | ICD-10-CM | POA: Diagnosis not present

## 2013-12-30 DIAGNOSIS — R0989 Other specified symptoms and signs involving the circulatory and respiratory systems: Secondary | ICD-10-CM | POA: Diagnosis not present

## 2013-12-30 DIAGNOSIS — F411 Generalized anxiety disorder: Secondary | ICD-10-CM | POA: Diagnosis not present

## 2013-12-30 DIAGNOSIS — G4730 Sleep apnea, unspecified: Secondary | ICD-10-CM | POA: Diagnosis not present

## 2013-12-30 DIAGNOSIS — G4710 Hypersomnia, unspecified: Secondary | ICD-10-CM | POA: Diagnosis not present

## 2013-12-30 DIAGNOSIS — R0609 Other forms of dyspnea: Secondary | ICD-10-CM | POA: Diagnosis not present

## 2013-12-30 DIAGNOSIS — J309 Allergic rhinitis, unspecified: Secondary | ICD-10-CM | POA: Diagnosis not present

## 2014-01-15 DIAGNOSIS — F17200 Nicotine dependence, unspecified, uncomplicated: Secondary | ICD-10-CM | POA: Diagnosis not present

## 2014-01-15 DIAGNOSIS — I10 Essential (primary) hypertension: Secondary | ICD-10-CM | POA: Diagnosis not present

## 2014-01-15 DIAGNOSIS — R002 Palpitations: Secondary | ICD-10-CM | POA: Diagnosis not present

## 2014-01-20 DIAGNOSIS — Z8673 Personal history of transient ischemic attack (TIA), and cerebral infarction without residual deficits: Secondary | ICD-10-CM | POA: Diagnosis not present

## 2014-01-20 DIAGNOSIS — I10 Essential (primary) hypertension: Secondary | ICD-10-CM | POA: Diagnosis not present

## 2014-01-20 DIAGNOSIS — R071 Chest pain on breathing: Secondary | ICD-10-CM | POA: Diagnosis not present

## 2014-01-20 DIAGNOSIS — R209 Unspecified disturbances of skin sensation: Secondary | ICD-10-CM | POA: Diagnosis not present

## 2014-01-20 DIAGNOSIS — Z79899 Other long term (current) drug therapy: Secondary | ICD-10-CM | POA: Diagnosis not present

## 2014-01-20 DIAGNOSIS — K219 Gastro-esophageal reflux disease without esophagitis: Secondary | ICD-10-CM | POA: Diagnosis not present

## 2014-01-20 DIAGNOSIS — R0602 Shortness of breath: Secondary | ICD-10-CM | POA: Diagnosis not present

## 2014-01-20 DIAGNOSIS — F411 Generalized anxiety disorder: Secondary | ICD-10-CM | POA: Diagnosis not present

## 2014-01-23 DIAGNOSIS — M62838 Other muscle spasm: Secondary | ICD-10-CM | POA: Diagnosis not present

## 2014-01-23 DIAGNOSIS — Z6834 Body mass index (BMI) 34.0-34.9, adult: Secondary | ICD-10-CM | POA: Diagnosis not present

## 2014-01-23 DIAGNOSIS — R7301 Impaired fasting glucose: Secondary | ICD-10-CM | POA: Diagnosis not present

## 2014-02-12 DIAGNOSIS — R5383 Other fatigue: Secondary | ICD-10-CM | POA: Diagnosis not present

## 2014-02-12 DIAGNOSIS — R5381 Other malaise: Secondary | ICD-10-CM | POA: Diagnosis not present

## 2014-02-12 DIAGNOSIS — R42 Dizziness and giddiness: Secondary | ICD-10-CM | POA: Diagnosis not present

## 2014-02-12 DIAGNOSIS — R7301 Impaired fasting glucose: Secondary | ICD-10-CM | POA: Diagnosis not present

## 2014-04-01 DIAGNOSIS — K7689 Other specified diseases of liver: Secondary | ICD-10-CM | POA: Diagnosis not present

## 2014-04-01 DIAGNOSIS — R1011 Right upper quadrant pain: Secondary | ICD-10-CM | POA: Diagnosis not present

## 2014-04-01 DIAGNOSIS — K5790 Diverticulosis of intestine, part unspecified, without perforation or abscess without bleeding: Secondary | ICD-10-CM | POA: Diagnosis not present

## 2014-04-01 DIAGNOSIS — K5730 Diverticulosis of large intestine without perforation or abscess without bleeding: Secondary | ICD-10-CM | POA: Diagnosis not present

## 2014-04-01 DIAGNOSIS — R1084 Generalized abdominal pain: Secondary | ICD-10-CM | POA: Diagnosis not present

## 2014-04-01 DIAGNOSIS — Z87891 Personal history of nicotine dependence: Secondary | ICD-10-CM | POA: Diagnosis not present

## 2014-04-02 DIAGNOSIS — K5730 Diverticulosis of large intestine without perforation or abscess without bleeding: Secondary | ICD-10-CM | POA: Diagnosis not present

## 2014-04-02 DIAGNOSIS — K7689 Other specified diseases of liver: Secondary | ICD-10-CM | POA: Diagnosis not present

## 2014-04-02 DIAGNOSIS — R1011 Right upper quadrant pain: Secondary | ICD-10-CM | POA: Diagnosis not present

## 2014-04-14 DIAGNOSIS — K219 Gastro-esophageal reflux disease without esophagitis: Secondary | ICD-10-CM | POA: Diagnosis not present

## 2014-06-04 DIAGNOSIS — F411 Generalized anxiety disorder: Secondary | ICD-10-CM | POA: Diagnosis not present

## 2014-06-04 DIAGNOSIS — J309 Allergic rhinitis, unspecified: Secondary | ICD-10-CM | POA: Diagnosis not present

## 2014-06-04 DIAGNOSIS — G4733 Obstructive sleep apnea (adult) (pediatric): Secondary | ICD-10-CM | POA: Diagnosis not present

## 2014-06-04 DIAGNOSIS — R0600 Dyspnea, unspecified: Secondary | ICD-10-CM | POA: Diagnosis not present

## 2014-07-03 DIAGNOSIS — K760 Fatty (change of) liver, not elsewhere classified: Secondary | ICD-10-CM | POA: Diagnosis not present

## 2014-07-03 DIAGNOSIS — Z6834 Body mass index (BMI) 34.0-34.9, adult: Secondary | ICD-10-CM | POA: Diagnosis not present

## 2014-07-09 DIAGNOSIS — Z23 Encounter for immunization: Secondary | ICD-10-CM | POA: Diagnosis not present

## 2014-08-06 DIAGNOSIS — Z23 Encounter for immunization: Secondary | ICD-10-CM | POA: Diagnosis not present

## 2014-08-09 DIAGNOSIS — K760 Fatty (change of) liver, not elsewhere classified: Secondary | ICD-10-CM | POA: Diagnosis not present

## 2014-08-09 DIAGNOSIS — R109 Unspecified abdominal pain: Secondary | ICD-10-CM | POA: Diagnosis not present

## 2014-08-09 DIAGNOSIS — Z87891 Personal history of nicotine dependence: Secondary | ICD-10-CM | POA: Diagnosis not present

## 2014-08-09 DIAGNOSIS — Z6834 Body mass index (BMI) 34.0-34.9, adult: Secondary | ICD-10-CM | POA: Diagnosis not present

## 2014-08-09 DIAGNOSIS — K219 Gastro-esophageal reflux disease without esophagitis: Secondary | ICD-10-CM | POA: Diagnosis not present

## 2014-08-09 DIAGNOSIS — R1084 Generalized abdominal pain: Secondary | ICD-10-CM | POA: Diagnosis not present

## 2014-08-09 DIAGNOSIS — N390 Urinary tract infection, site not specified: Secondary | ICD-10-CM | POA: Diagnosis not present

## 2014-08-09 DIAGNOSIS — I4891 Unspecified atrial fibrillation: Secondary | ICD-10-CM | POA: Diagnosis not present

## 2014-08-09 DIAGNOSIS — R079 Chest pain, unspecified: Secondary | ICD-10-CM | POA: Diagnosis not present

## 2014-08-09 DIAGNOSIS — R1013 Epigastric pain: Secondary | ICD-10-CM | POA: Diagnosis not present

## 2014-08-09 DIAGNOSIS — I10 Essential (primary) hypertension: Secondary | ICD-10-CM | POA: Diagnosis not present

## 2014-08-13 DIAGNOSIS — K589 Irritable bowel syndrome without diarrhea: Secondary | ICD-10-CM | POA: Diagnosis not present

## 2014-08-13 DIAGNOSIS — Z6834 Body mass index (BMI) 34.0-34.9, adult: Secondary | ICD-10-CM | POA: Diagnosis not present

## 2014-08-28 DIAGNOSIS — K589 Irritable bowel syndrome without diarrhea: Secondary | ICD-10-CM | POA: Diagnosis not present

## 2014-08-28 DIAGNOSIS — K219 Gastro-esophageal reflux disease without esophagitis: Secondary | ICD-10-CM | POA: Diagnosis not present

## 2014-08-28 DIAGNOSIS — R109 Unspecified abdominal pain: Secondary | ICD-10-CM | POA: Diagnosis not present

## 2014-10-06 DIAGNOSIS — G4733 Obstructive sleep apnea (adult) (pediatric): Secondary | ICD-10-CM | POA: Diagnosis not present

## 2014-10-06 DIAGNOSIS — G2581 Restless legs syndrome: Secondary | ICD-10-CM | POA: Diagnosis not present

## 2014-10-06 DIAGNOSIS — J309 Allergic rhinitis, unspecified: Secondary | ICD-10-CM | POA: Diagnosis not present

## 2014-10-06 DIAGNOSIS — R0600 Dyspnea, unspecified: Secondary | ICD-10-CM | POA: Diagnosis not present

## 2014-12-31 DIAGNOSIS — R002 Palpitations: Secondary | ICD-10-CM

## 2014-12-31 HISTORY — DX: Palpitations: R00.2

## 2015-01-07 DIAGNOSIS — R002 Palpitations: Secondary | ICD-10-CM | POA: Diagnosis not present

## 2015-01-13 DIAGNOSIS — Z23 Encounter for immunization: Secondary | ICD-10-CM | POA: Diagnosis not present

## 2015-03-04 DIAGNOSIS — Z6835 Body mass index (BMI) 35.0-35.9, adult: Secondary | ICD-10-CM | POA: Diagnosis not present

## 2015-03-04 DIAGNOSIS — K0889 Other specified disorders of teeth and supporting structures: Secondary | ICD-10-CM | POA: Diagnosis not present

## 2015-03-04 DIAGNOSIS — J019 Acute sinusitis, unspecified: Secondary | ICD-10-CM | POA: Diagnosis not present

## 2015-03-30 DIAGNOSIS — G2581 Restless legs syndrome: Secondary | ICD-10-CM | POA: Diagnosis not present

## 2015-03-30 DIAGNOSIS — R0600 Dyspnea, unspecified: Secondary | ICD-10-CM | POA: Diagnosis not present

## 2015-03-30 DIAGNOSIS — G4733 Obstructive sleep apnea (adult) (pediatric): Secondary | ICD-10-CM | POA: Diagnosis not present

## 2015-03-30 DIAGNOSIS — J309 Allergic rhinitis, unspecified: Secondary | ICD-10-CM | POA: Diagnosis not present

## 2015-04-27 DIAGNOSIS — R1010 Upper abdominal pain, unspecified: Secondary | ICD-10-CM | POA: Diagnosis not present

## 2015-04-27 DIAGNOSIS — Z79899 Other long term (current) drug therapy: Secondary | ICD-10-CM | POA: Diagnosis not present

## 2015-04-27 DIAGNOSIS — R1084 Generalized abdominal pain: Secondary | ICD-10-CM | POA: Diagnosis not present

## 2015-04-27 DIAGNOSIS — I4891 Unspecified atrial fibrillation: Secondary | ICD-10-CM | POA: Diagnosis not present

## 2015-04-27 DIAGNOSIS — R1909 Other intra-abdominal and pelvic swelling, mass and lump: Secondary | ICD-10-CM | POA: Diagnosis not present

## 2015-04-27 DIAGNOSIS — K219 Gastro-esophageal reflux disease without esophagitis: Secondary | ICD-10-CM | POA: Diagnosis not present

## 2015-04-27 DIAGNOSIS — I10 Essential (primary) hypertension: Secondary | ICD-10-CM | POA: Diagnosis not present

## 2015-05-01 DIAGNOSIS — R1013 Epigastric pain: Secondary | ICD-10-CM | POA: Diagnosis not present

## 2015-05-01 DIAGNOSIS — K760 Fatty (change of) liver, not elsewhere classified: Secondary | ICD-10-CM | POA: Diagnosis not present

## 2015-05-01 DIAGNOSIS — K921 Melena: Secondary | ICD-10-CM | POA: Diagnosis not present

## 2015-05-05 DIAGNOSIS — K317 Polyp of stomach and duodenum: Secondary | ICD-10-CM | POA: Diagnosis not present

## 2015-05-05 DIAGNOSIS — R1013 Epigastric pain: Secondary | ICD-10-CM | POA: Diagnosis not present

## 2015-05-05 DIAGNOSIS — K2950 Unspecified chronic gastritis without bleeding: Secondary | ICD-10-CM | POA: Diagnosis not present

## 2015-07-06 DIAGNOSIS — R002 Palpitations: Secondary | ICD-10-CM | POA: Diagnosis not present

## 2015-08-05 DIAGNOSIS — M5136 Other intervertebral disc degeneration, lumbar region: Secondary | ICD-10-CM | POA: Diagnosis not present

## 2015-08-05 DIAGNOSIS — Z6836 Body mass index (BMI) 36.0-36.9, adult: Secondary | ICD-10-CM | POA: Diagnosis not present

## 2015-08-07 DIAGNOSIS — M5416 Radiculopathy, lumbar region: Secondary | ICD-10-CM | POA: Diagnosis not present

## 2015-08-07 DIAGNOSIS — M5136 Other intervertebral disc degeneration, lumbar region: Secondary | ICD-10-CM | POA: Diagnosis not present

## 2015-08-07 DIAGNOSIS — M25551 Pain in right hip: Secondary | ICD-10-CM | POA: Diagnosis not present

## 2015-08-07 DIAGNOSIS — M5116 Intervertebral disc disorders with radiculopathy, lumbar region: Secondary | ICD-10-CM | POA: Diagnosis not present

## 2015-08-10 DIAGNOSIS — M5431 Sciatica, right side: Secondary | ICD-10-CM | POA: Diagnosis not present

## 2015-08-10 DIAGNOSIS — Z6835 Body mass index (BMI) 35.0-35.9, adult: Secondary | ICD-10-CM | POA: Diagnosis not present

## 2015-08-10 DIAGNOSIS — M5136 Other intervertebral disc degeneration, lumbar region: Secondary | ICD-10-CM | POA: Diagnosis not present

## 2015-08-19 DIAGNOSIS — G2581 Restless legs syndrome: Secondary | ICD-10-CM | POA: Diagnosis not present

## 2015-08-19 DIAGNOSIS — J301 Allergic rhinitis due to pollen: Secondary | ICD-10-CM | POA: Diagnosis not present

## 2015-08-19 DIAGNOSIS — G4733 Obstructive sleep apnea (adult) (pediatric): Secondary | ICD-10-CM | POA: Diagnosis not present

## 2015-08-19 DIAGNOSIS — R0600 Dyspnea, unspecified: Secondary | ICD-10-CM | POA: Diagnosis not present

## 2015-08-19 DIAGNOSIS — F411 Generalized anxiety disorder: Secondary | ICD-10-CM | POA: Diagnosis not present

## 2015-08-24 DIAGNOSIS — R262 Difficulty in walking, not elsewhere classified: Secondary | ICD-10-CM | POA: Diagnosis not present

## 2015-08-24 DIAGNOSIS — M6281 Muscle weakness (generalized): Secondary | ICD-10-CM | POA: Diagnosis not present

## 2015-08-24 DIAGNOSIS — M5441 Lumbago with sciatica, right side: Secondary | ICD-10-CM | POA: Diagnosis not present

## 2015-08-24 DIAGNOSIS — I10 Essential (primary) hypertension: Secondary | ICD-10-CM | POA: Diagnosis not present

## 2015-08-31 DIAGNOSIS — M5441 Lumbago with sciatica, right side: Secondary | ICD-10-CM | POA: Diagnosis not present

## 2015-08-31 DIAGNOSIS — R262 Difficulty in walking, not elsewhere classified: Secondary | ICD-10-CM | POA: Diagnosis not present

## 2015-08-31 DIAGNOSIS — I10 Essential (primary) hypertension: Secondary | ICD-10-CM | POA: Diagnosis not present

## 2015-08-31 DIAGNOSIS — M6281 Muscle weakness (generalized): Secondary | ICD-10-CM | POA: Diagnosis not present

## 2015-09-03 DIAGNOSIS — M5441 Lumbago with sciatica, right side: Secondary | ICD-10-CM | POA: Diagnosis not present

## 2015-09-03 DIAGNOSIS — I10 Essential (primary) hypertension: Secondary | ICD-10-CM | POA: Diagnosis not present

## 2015-09-03 DIAGNOSIS — R262 Difficulty in walking, not elsewhere classified: Secondary | ICD-10-CM | POA: Diagnosis not present

## 2015-09-03 DIAGNOSIS — M6281 Muscle weakness (generalized): Secondary | ICD-10-CM | POA: Diagnosis not present

## 2015-09-08 DIAGNOSIS — M5441 Lumbago with sciatica, right side: Secondary | ICD-10-CM | POA: Diagnosis not present

## 2015-09-08 DIAGNOSIS — M6281 Muscle weakness (generalized): Secondary | ICD-10-CM | POA: Diagnosis not present

## 2015-09-08 DIAGNOSIS — R262 Difficulty in walking, not elsewhere classified: Secondary | ICD-10-CM | POA: Diagnosis not present

## 2015-09-08 DIAGNOSIS — I10 Essential (primary) hypertension: Secondary | ICD-10-CM | POA: Diagnosis not present

## 2015-09-25 DIAGNOSIS — G4489 Other headache syndrome: Secondary | ICD-10-CM | POA: Diagnosis not present

## 2015-09-25 DIAGNOSIS — H538 Other visual disturbances: Secondary | ICD-10-CM | POA: Diagnosis not present

## 2015-09-25 DIAGNOSIS — L309 Dermatitis, unspecified: Secondary | ICD-10-CM | POA: Diagnosis not present

## 2015-09-25 DIAGNOSIS — Z6835 Body mass index (BMI) 35.0-35.9, adult: Secondary | ICD-10-CM | POA: Diagnosis not present

## 2015-09-29 DIAGNOSIS — S70361A Insect bite (nonvenomous), right thigh, initial encounter: Secondary | ICD-10-CM | POA: Diagnosis not present

## 2015-09-29 DIAGNOSIS — T148 Other injury of unspecified body region: Secondary | ICD-10-CM | POA: Diagnosis not present

## 2015-09-29 DIAGNOSIS — Z6835 Body mass index (BMI) 35.0-35.9, adult: Secondary | ICD-10-CM | POA: Diagnosis not present

## 2015-09-29 DIAGNOSIS — M62830 Muscle spasm of back: Secondary | ICD-10-CM | POA: Diagnosis not present

## 2015-09-30 DIAGNOSIS — H538 Other visual disturbances: Secondary | ICD-10-CM | POA: Diagnosis not present

## 2015-09-30 DIAGNOSIS — R51 Headache: Secondary | ICD-10-CM | POA: Diagnosis not present

## 2015-09-30 DIAGNOSIS — G4489 Other headache syndrome: Secondary | ICD-10-CM | POA: Diagnosis not present

## 2015-09-30 DIAGNOSIS — S0990XA Unspecified injury of head, initial encounter: Secondary | ICD-10-CM | POA: Diagnosis not present

## 2015-12-10 DIAGNOSIS — G2581 Restless legs syndrome: Secondary | ICD-10-CM | POA: Diagnosis not present

## 2015-12-10 DIAGNOSIS — J301 Allergic rhinitis due to pollen: Secondary | ICD-10-CM | POA: Diagnosis not present

## 2015-12-10 DIAGNOSIS — J4520 Mild intermittent asthma, uncomplicated: Secondary | ICD-10-CM | POA: Diagnosis not present

## 2015-12-10 DIAGNOSIS — G4733 Obstructive sleep apnea (adult) (pediatric): Secondary | ICD-10-CM | POA: Diagnosis not present

## 2015-12-11 DIAGNOSIS — R109 Unspecified abdominal pain: Secondary | ICD-10-CM | POA: Diagnosis not present

## 2015-12-11 DIAGNOSIS — F411 Generalized anxiety disorder: Secondary | ICD-10-CM | POA: Diagnosis not present

## 2015-12-11 DIAGNOSIS — K449 Diaphragmatic hernia without obstruction or gangrene: Secondary | ICD-10-CM | POA: Diagnosis not present

## 2015-12-11 DIAGNOSIS — R1013 Epigastric pain: Secondary | ICD-10-CM | POA: Diagnosis not present

## 2015-12-15 DIAGNOSIS — K219 Gastro-esophageal reflux disease without esophagitis: Secondary | ICD-10-CM

## 2015-12-15 DIAGNOSIS — Z79899 Other long term (current) drug therapy: Secondary | ICD-10-CM | POA: Diagnosis not present

## 2015-12-15 DIAGNOSIS — R1013 Epigastric pain: Secondary | ICD-10-CM | POA: Diagnosis not present

## 2015-12-15 DIAGNOSIS — Z1211 Encounter for screening for malignant neoplasm of colon: Secondary | ICD-10-CM | POA: Diagnosis not present

## 2015-12-15 DIAGNOSIS — Z860100 Personal history of colon polyps, unspecified: Secondary | ICD-10-CM

## 2015-12-15 DIAGNOSIS — K581 Irritable bowel syndrome with constipation: Secondary | ICD-10-CM

## 2015-12-15 DIAGNOSIS — Z800 Family history of malignant neoplasm of digestive organs: Secondary | ICD-10-CM

## 2015-12-15 DIAGNOSIS — R1033 Periumbilical pain: Secondary | ICD-10-CM | POA: Diagnosis not present

## 2015-12-15 HISTORY — DX: Irritable bowel syndrome with constipation: K58.1

## 2015-12-15 HISTORY — DX: Personal history of colon polyps, unspecified: Z86.0100

## 2015-12-15 HISTORY — DX: Other long term (current) drug therapy: Z79.899

## 2015-12-15 HISTORY — DX: Personal history of colonic polyps: Z86.010

## 2015-12-15 HISTORY — DX: Gastro-esophageal reflux disease without esophagitis: K21.9

## 2015-12-15 HISTORY — DX: Family history of malignant neoplasm of digestive organs: Z80.0

## 2015-12-17 DIAGNOSIS — G4733 Obstructive sleep apnea (adult) (pediatric): Secondary | ICD-10-CM | POA: Diagnosis not present

## 2015-12-18 DIAGNOSIS — S161XXA Strain of muscle, fascia and tendon at neck level, initial encounter: Secondary | ICD-10-CM | POA: Diagnosis not present

## 2015-12-18 DIAGNOSIS — Z8342 Family history of familial hypercholesterolemia: Secondary | ICD-10-CM | POA: Diagnosis not present

## 2015-12-18 DIAGNOSIS — Z13220 Encounter for screening for lipoid disorders: Secondary | ICD-10-CM | POA: Diagnosis not present

## 2015-12-18 DIAGNOSIS — Z6835 Body mass index (BMI) 35.0-35.9, adult: Secondary | ICD-10-CM | POA: Diagnosis not present

## 2016-01-07 DIAGNOSIS — R197 Diarrhea, unspecified: Secondary | ICD-10-CM | POA: Diagnosis not present

## 2016-01-07 DIAGNOSIS — K5730 Diverticulosis of large intestine without perforation or abscess without bleeding: Secondary | ICD-10-CM | POA: Diagnosis not present

## 2016-01-07 DIAGNOSIS — R1013 Epigastric pain: Secondary | ICD-10-CM | POA: Diagnosis not present

## 2016-01-07 DIAGNOSIS — Z0000 Encounter for general adult medical examination without abnormal findings: Secondary | ICD-10-CM | POA: Diagnosis not present

## 2016-01-07 DIAGNOSIS — Z86010 Personal history of colonic polyps: Secondary | ICD-10-CM | POA: Diagnosis not present

## 2016-01-07 DIAGNOSIS — K2270 Barrett's esophagus without dysplasia: Secondary | ICD-10-CM | POA: Diagnosis not present

## 2016-01-07 DIAGNOSIS — D128 Benign neoplasm of rectum: Secondary | ICD-10-CM | POA: Diagnosis not present

## 2016-01-07 DIAGNOSIS — K921 Melena: Secondary | ICD-10-CM | POA: Diagnosis not present

## 2016-01-07 DIAGNOSIS — K648 Other hemorrhoids: Secondary | ICD-10-CM | POA: Diagnosis not present

## 2016-01-07 DIAGNOSIS — K317 Polyp of stomach and duodenum: Secondary | ICD-10-CM | POA: Diagnosis not present

## 2016-01-07 DIAGNOSIS — D123 Benign neoplasm of transverse colon: Secondary | ICD-10-CM | POA: Diagnosis not present

## 2016-01-07 DIAGNOSIS — R1084 Generalized abdominal pain: Secondary | ICD-10-CM | POA: Diagnosis not present

## 2016-01-07 DIAGNOSIS — K621 Rectal polyp: Secondary | ICD-10-CM | POA: Diagnosis not present

## 2016-01-13 DIAGNOSIS — R51 Headache: Secondary | ICD-10-CM | POA: Diagnosis not present

## 2016-01-13 DIAGNOSIS — Z6835 Body mass index (BMI) 35.0-35.9, adult: Secondary | ICD-10-CM | POA: Diagnosis not present

## 2016-01-21 DIAGNOSIS — R51 Headache: Secondary | ICD-10-CM | POA: Diagnosis not present

## 2016-02-10 DIAGNOSIS — I10 Essential (primary) hypertension: Secondary | ICD-10-CM | POA: Diagnosis not present

## 2016-02-10 DIAGNOSIS — Z6835 Body mass index (BMI) 35.0-35.9, adult: Secondary | ICD-10-CM | POA: Diagnosis not present

## 2016-02-10 DIAGNOSIS — R51 Headache: Secondary | ICD-10-CM | POA: Diagnosis not present

## 2016-02-15 DIAGNOSIS — J209 Acute bronchitis, unspecified: Secondary | ICD-10-CM | POA: Diagnosis not present

## 2016-02-15 DIAGNOSIS — J019 Acute sinusitis, unspecified: Secondary | ICD-10-CM | POA: Diagnosis not present

## 2016-02-15 DIAGNOSIS — Z6835 Body mass index (BMI) 35.0-35.9, adult: Secondary | ICD-10-CM | POA: Diagnosis not present

## 2016-03-01 DIAGNOSIS — Z6834 Body mass index (BMI) 34.0-34.9, adult: Secondary | ICD-10-CM | POA: Diagnosis not present

## 2016-03-01 DIAGNOSIS — N390 Urinary tract infection, site not specified: Secondary | ICD-10-CM | POA: Diagnosis not present

## 2016-03-01 DIAGNOSIS — H6090 Unspecified otitis externa, unspecified ear: Secondary | ICD-10-CM | POA: Diagnosis not present

## 2016-03-15 DIAGNOSIS — R0600 Dyspnea, unspecified: Secondary | ICD-10-CM | POA: Diagnosis not present

## 2016-03-15 DIAGNOSIS — J301 Allergic rhinitis due to pollen: Secondary | ICD-10-CM | POA: Diagnosis not present

## 2016-03-15 DIAGNOSIS — G2581 Restless legs syndrome: Secondary | ICD-10-CM | POA: Diagnosis not present

## 2016-03-15 DIAGNOSIS — G4733 Obstructive sleep apnea (adult) (pediatric): Secondary | ICD-10-CM | POA: Diagnosis not present

## 2016-03-16 DIAGNOSIS — R002 Palpitations: Secondary | ICD-10-CM | POA: Diagnosis not present

## 2016-03-17 DIAGNOSIS — B373 Candidiasis of vulva and vagina: Secondary | ICD-10-CM | POA: Diagnosis not present

## 2016-03-17 DIAGNOSIS — Z6836 Body mass index (BMI) 36.0-36.9, adult: Secondary | ICD-10-CM | POA: Diagnosis not present

## 2016-05-09 DIAGNOSIS — K30 Functional dyspepsia: Secondary | ICD-10-CM | POA: Diagnosis not present

## 2016-05-09 DIAGNOSIS — Z6836 Body mass index (BMI) 36.0-36.9, adult: Secondary | ICD-10-CM | POA: Diagnosis not present

## 2016-05-28 DIAGNOSIS — R1013 Epigastric pain: Secondary | ICD-10-CM | POA: Diagnosis not present

## 2016-05-28 DIAGNOSIS — R0602 Shortness of breath: Secondary | ICD-10-CM | POA: Diagnosis not present

## 2016-06-04 DIAGNOSIS — R51 Headache: Secondary | ICD-10-CM | POA: Diagnosis not present

## 2016-06-04 DIAGNOSIS — K30 Functional dyspepsia: Secondary | ICD-10-CM | POA: Diagnosis not present

## 2016-06-17 DIAGNOSIS — R1010 Upper abdominal pain, unspecified: Secondary | ICD-10-CM | POA: Diagnosis not present

## 2016-06-17 DIAGNOSIS — K219 Gastro-esophageal reflux disease without esophagitis: Secondary | ICD-10-CM | POA: Diagnosis not present

## 2016-06-17 DIAGNOSIS — R110 Nausea: Secondary | ICD-10-CM | POA: Diagnosis not present

## 2016-07-07 DIAGNOSIS — R079 Chest pain, unspecified: Secondary | ICD-10-CM

## 2016-07-07 DIAGNOSIS — R002 Palpitations: Secondary | ICD-10-CM | POA: Diagnosis not present

## 2016-07-07 HISTORY — DX: Chest pain, unspecified: R07.9

## 2016-07-11 DIAGNOSIS — R0600 Dyspnea, unspecified: Secondary | ICD-10-CM | POA: Diagnosis not present

## 2016-07-11 DIAGNOSIS — J301 Allergic rhinitis due to pollen: Secondary | ICD-10-CM | POA: Diagnosis not present

## 2016-07-11 DIAGNOSIS — G4733 Obstructive sleep apnea (adult) (pediatric): Secondary | ICD-10-CM | POA: Diagnosis not present

## 2016-07-11 DIAGNOSIS — G2581 Restless legs syndrome: Secondary | ICD-10-CM | POA: Diagnosis not present

## 2016-07-11 DIAGNOSIS — F411 Generalized anxiety disorder: Secondary | ICD-10-CM | POA: Diagnosis not present

## 2016-07-21 DIAGNOSIS — R002 Palpitations: Secondary | ICD-10-CM | POA: Diagnosis not present

## 2016-07-21 DIAGNOSIS — R079 Chest pain, unspecified: Secondary | ICD-10-CM | POA: Diagnosis not present

## 2016-07-23 DIAGNOSIS — R002 Palpitations: Secondary | ICD-10-CM | POA: Diagnosis not present

## 2016-07-26 DIAGNOSIS — Z6836 Body mass index (BMI) 36.0-36.9, adult: Secondary | ICD-10-CM | POA: Diagnosis not present

## 2016-07-26 DIAGNOSIS — N9089 Other specified noninflammatory disorders of vulva and perineum: Secondary | ICD-10-CM | POA: Diagnosis not present

## 2016-07-26 DIAGNOSIS — N390 Urinary tract infection, site not specified: Secondary | ICD-10-CM | POA: Diagnosis not present

## 2016-08-05 DIAGNOSIS — R002 Palpitations: Secondary | ICD-10-CM | POA: Diagnosis not present

## 2016-08-05 DIAGNOSIS — R079 Chest pain, unspecified: Secondary | ICD-10-CM | POA: Diagnosis not present

## 2016-11-03 DIAGNOSIS — G4733 Obstructive sleep apnea (adult) (pediatric): Secondary | ICD-10-CM | POA: Diagnosis not present

## 2016-11-03 DIAGNOSIS — F411 Generalized anxiety disorder: Secondary | ICD-10-CM | POA: Diagnosis not present

## 2016-11-03 DIAGNOSIS — G2581 Restless legs syndrome: Secondary | ICD-10-CM | POA: Diagnosis not present

## 2016-11-03 DIAGNOSIS — R0600 Dyspnea, unspecified: Secondary | ICD-10-CM | POA: Diagnosis not present

## 2016-11-03 DIAGNOSIS — J301 Allergic rhinitis due to pollen: Secondary | ICD-10-CM | POA: Diagnosis not present

## 2016-11-09 DIAGNOSIS — I10 Essential (primary) hypertension: Secondary | ICD-10-CM | POA: Diagnosis not present

## 2016-11-09 DIAGNOSIS — Z13220 Encounter for screening for lipoid disorders: Secondary | ICD-10-CM | POA: Diagnosis not present

## 2016-11-09 DIAGNOSIS — Z833 Family history of diabetes mellitus: Secondary | ICD-10-CM | POA: Diagnosis not present

## 2016-11-09 DIAGNOSIS — Z6835 Body mass index (BMI) 35.0-35.9, adult: Secondary | ICD-10-CM | POA: Diagnosis not present

## 2016-11-09 DIAGNOSIS — M25551 Pain in right hip: Secondary | ICD-10-CM | POA: Diagnosis not present

## 2016-11-09 DIAGNOSIS — R58 Hemorrhage, not elsewhere classified: Secondary | ICD-10-CM | POA: Diagnosis not present

## 2016-11-09 DIAGNOSIS — R7301 Impaired fasting glucose: Secondary | ICD-10-CM | POA: Diagnosis not present

## 2016-12-23 DIAGNOSIS — R1032 Left lower quadrant pain: Secondary | ICD-10-CM | POA: Diagnosis not present

## 2016-12-23 DIAGNOSIS — K5792 Diverticulitis of intestine, part unspecified, without perforation or abscess without bleeding: Secondary | ICD-10-CM | POA: Diagnosis not present

## 2016-12-23 DIAGNOSIS — K5730 Diverticulosis of large intestine without perforation or abscess without bleeding: Secondary | ICD-10-CM | POA: Diagnosis not present

## 2016-12-27 DIAGNOSIS — R300 Dysuria: Secondary | ICD-10-CM | POA: Diagnosis not present

## 2016-12-27 DIAGNOSIS — R197 Diarrhea, unspecified: Secondary | ICD-10-CM | POA: Diagnosis not present

## 2016-12-27 DIAGNOSIS — R109 Unspecified abdominal pain: Secondary | ICD-10-CM | POA: Diagnosis not present

## 2016-12-27 DIAGNOSIS — T7840XA Allergy, unspecified, initial encounter: Secondary | ICD-10-CM | POA: Diagnosis not present

## 2016-12-27 DIAGNOSIS — Z6833 Body mass index (BMI) 33.0-33.9, adult: Secondary | ICD-10-CM | POA: Diagnosis not present

## 2017-02-01 DIAGNOSIS — J301 Allergic rhinitis due to pollen: Secondary | ICD-10-CM | POA: Diagnosis not present

## 2017-02-01 DIAGNOSIS — R0600 Dyspnea, unspecified: Secondary | ICD-10-CM | POA: Diagnosis not present

## 2017-02-01 DIAGNOSIS — G2581 Restless legs syndrome: Secondary | ICD-10-CM | POA: Diagnosis not present

## 2017-02-01 DIAGNOSIS — G4733 Obstructive sleep apnea (adult) (pediatric): Secondary | ICD-10-CM | POA: Diagnosis not present

## 2017-02-01 DIAGNOSIS — F411 Generalized anxiety disorder: Secondary | ICD-10-CM | POA: Diagnosis not present

## 2017-02-06 DIAGNOSIS — F411 Generalized anxiety disorder: Secondary | ICD-10-CM | POA: Diagnosis not present

## 2017-02-21 DIAGNOSIS — F329 Major depressive disorder, single episode, unspecified: Secondary | ICD-10-CM | POA: Diagnosis not present

## 2017-02-21 DIAGNOSIS — Z6828 Body mass index (BMI) 28.0-28.9, adult: Secondary | ICD-10-CM | POA: Diagnosis not present

## 2017-02-21 DIAGNOSIS — M791 Myalgia, unspecified site: Secondary | ICD-10-CM | POA: Diagnosis not present

## 2017-03-01 DIAGNOSIS — G2581 Restless legs syndrome: Secondary | ICD-10-CM | POA: Diagnosis not present

## 2017-03-01 DIAGNOSIS — R0600 Dyspnea, unspecified: Secondary | ICD-10-CM | POA: Diagnosis not present

## 2017-03-01 DIAGNOSIS — J301 Allergic rhinitis due to pollen: Secondary | ICD-10-CM | POA: Diagnosis not present

## 2017-03-01 DIAGNOSIS — F419 Anxiety disorder, unspecified: Secondary | ICD-10-CM | POA: Diagnosis not present

## 2017-03-01 DIAGNOSIS — F411 Generalized anxiety disorder: Secondary | ICD-10-CM | POA: Diagnosis not present

## 2017-03-01 DIAGNOSIS — G4733 Obstructive sleep apnea (adult) (pediatric): Secondary | ICD-10-CM | POA: Diagnosis not present

## 2017-04-20 DIAGNOSIS — B373 Candidiasis of vulva and vagina: Secondary | ICD-10-CM | POA: Diagnosis not present

## 2017-04-20 DIAGNOSIS — M791 Myalgia, unspecified site: Secondary | ICD-10-CM | POA: Diagnosis not present

## 2017-04-20 DIAGNOSIS — Z6828 Body mass index (BMI) 28.0-28.9, adult: Secondary | ICD-10-CM | POA: Diagnosis not present

## 2017-04-20 DIAGNOSIS — F329 Major depressive disorder, single episode, unspecified: Secondary | ICD-10-CM | POA: Diagnosis not present

## 2017-07-26 DIAGNOSIS — F419 Anxiety disorder, unspecified: Secondary | ICD-10-CM | POA: Diagnosis not present

## 2017-07-26 DIAGNOSIS — Z79899 Other long term (current) drug therapy: Secondary | ICD-10-CM | POA: Diagnosis not present

## 2017-07-26 DIAGNOSIS — K760 Fatty (change of) liver, not elsewhere classified: Secondary | ICD-10-CM | POA: Diagnosis not present

## 2017-07-26 DIAGNOSIS — E785 Hyperlipidemia, unspecified: Secondary | ICD-10-CM | POA: Diagnosis not present

## 2017-07-26 DIAGNOSIS — M1990 Unspecified osteoarthritis, unspecified site: Secondary | ICD-10-CM | POA: Diagnosis not present

## 2017-07-26 DIAGNOSIS — K219 Gastro-esophageal reflux disease without esophagitis: Secondary | ICD-10-CM | POA: Diagnosis not present

## 2017-07-26 DIAGNOSIS — E669 Obesity, unspecified: Secondary | ICD-10-CM | POA: Diagnosis not present

## 2017-07-26 DIAGNOSIS — Z131 Encounter for screening for diabetes mellitus: Secondary | ICD-10-CM | POA: Diagnosis not present

## 2017-07-26 DIAGNOSIS — E559 Vitamin D deficiency, unspecified: Secondary | ICD-10-CM | POA: Diagnosis not present

## 2017-07-26 DIAGNOSIS — Z6833 Body mass index (BMI) 33.0-33.9, adult: Secondary | ICD-10-CM | POA: Diagnosis not present

## 2017-07-26 DIAGNOSIS — J309 Allergic rhinitis, unspecified: Secondary | ICD-10-CM | POA: Diagnosis not present

## 2017-08-06 DIAGNOSIS — J45909 Unspecified asthma, uncomplicated: Secondary | ICD-10-CM | POA: Diagnosis not present

## 2017-08-28 DIAGNOSIS — G2581 Restless legs syndrome: Secondary | ICD-10-CM | POA: Diagnosis not present

## 2017-08-28 DIAGNOSIS — G4733 Obstructive sleep apnea (adult) (pediatric): Secondary | ICD-10-CM | POA: Diagnosis not present

## 2017-08-28 DIAGNOSIS — J4530 Mild persistent asthma, uncomplicated: Secondary | ICD-10-CM | POA: Diagnosis not present

## 2017-08-28 DIAGNOSIS — J309 Allergic rhinitis, unspecified: Secondary | ICD-10-CM | POA: Diagnosis not present

## 2017-08-28 DIAGNOSIS — F411 Generalized anxiety disorder: Secondary | ICD-10-CM | POA: Diagnosis not present

## 2017-09-06 DIAGNOSIS — J45909 Unspecified asthma, uncomplicated: Secondary | ICD-10-CM | POA: Diagnosis not present

## 2017-09-27 DIAGNOSIS — G4730 Sleep apnea, unspecified: Secondary | ICD-10-CM | POA: Diagnosis not present

## 2017-09-27 DIAGNOSIS — G4733 Obstructive sleep apnea (adult) (pediatric): Secondary | ICD-10-CM | POA: Diagnosis not present

## 2017-10-06 DIAGNOSIS — J45909 Unspecified asthma, uncomplicated: Secondary | ICD-10-CM | POA: Diagnosis not present

## 2017-10-11 DIAGNOSIS — G2581 Restless legs syndrome: Secondary | ICD-10-CM | POA: Diagnosis not present

## 2017-10-11 DIAGNOSIS — J4530 Mild persistent asthma, uncomplicated: Secondary | ICD-10-CM | POA: Diagnosis not present

## 2017-10-11 DIAGNOSIS — R0789 Other chest pain: Secondary | ICD-10-CM | POA: Diagnosis not present

## 2017-10-11 DIAGNOSIS — G4733 Obstructive sleep apnea (adult) (pediatric): Secondary | ICD-10-CM | POA: Diagnosis not present

## 2017-10-13 DIAGNOSIS — Z23 Encounter for immunization: Secondary | ICD-10-CM | POA: Diagnosis not present

## 2017-10-16 DIAGNOSIS — M549 Dorsalgia, unspecified: Secondary | ICD-10-CM | POA: Diagnosis not present

## 2017-10-16 DIAGNOSIS — I10 Essential (primary) hypertension: Secondary | ICD-10-CM | POA: Diagnosis not present

## 2017-10-16 DIAGNOSIS — Z79899 Other long term (current) drug therapy: Secondary | ICD-10-CM | POA: Diagnosis not present

## 2017-10-16 DIAGNOSIS — R1012 Left upper quadrant pain: Secondary | ICD-10-CM | POA: Diagnosis not present

## 2017-10-16 DIAGNOSIS — G8929 Other chronic pain: Secondary | ICD-10-CM | POA: Diagnosis not present

## 2017-10-16 DIAGNOSIS — R109 Unspecified abdominal pain: Secondary | ICD-10-CM | POA: Diagnosis not present

## 2017-10-16 DIAGNOSIS — R1084 Generalized abdominal pain: Secondary | ICD-10-CM | POA: Diagnosis not present

## 2017-10-16 DIAGNOSIS — Z8673 Personal history of transient ischemic attack (TIA), and cerebral infarction without residual deficits: Secondary | ICD-10-CM | POA: Diagnosis not present

## 2017-10-24 DIAGNOSIS — K2270 Barrett's esophagus without dysplasia: Secondary | ICD-10-CM | POA: Diagnosis not present

## 2017-10-24 DIAGNOSIS — K219 Gastro-esophageal reflux disease without esophagitis: Secondary | ICD-10-CM | POA: Diagnosis not present

## 2017-10-24 DIAGNOSIS — R1010 Upper abdominal pain, unspecified: Secondary | ICD-10-CM | POA: Diagnosis not present

## 2017-11-06 DIAGNOSIS — J45909 Unspecified asthma, uncomplicated: Secondary | ICD-10-CM | POA: Diagnosis not present

## 2017-11-29 DIAGNOSIS — Z1331 Encounter for screening for depression: Secondary | ICD-10-CM | POA: Diagnosis not present

## 2017-11-29 DIAGNOSIS — Z23 Encounter for immunization: Secondary | ICD-10-CM | POA: Diagnosis not present

## 2017-11-29 DIAGNOSIS — Z6833 Body mass index (BMI) 33.0-33.9, adult: Secondary | ICD-10-CM | POA: Diagnosis not present

## 2017-11-29 DIAGNOSIS — Z1339 Encounter for screening examination for other mental health and behavioral disorders: Secondary | ICD-10-CM | POA: Diagnosis not present

## 2017-11-29 DIAGNOSIS — Z9181 History of falling: Secondary | ICD-10-CM | POA: Diagnosis not present

## 2017-11-29 DIAGNOSIS — K219 Gastro-esophageal reflux disease without esophagitis: Secondary | ICD-10-CM | POA: Diagnosis not present

## 2017-11-29 DIAGNOSIS — Z833 Family history of diabetes mellitus: Secondary | ICD-10-CM | POA: Diagnosis not present

## 2017-11-29 DIAGNOSIS — R600 Localized edema: Secondary | ICD-10-CM | POA: Diagnosis not present

## 2017-11-29 DIAGNOSIS — E669 Obesity, unspecified: Secondary | ICD-10-CM | POA: Diagnosis not present

## 2017-12-06 DIAGNOSIS — J45909 Unspecified asthma, uncomplicated: Secondary | ICD-10-CM | POA: Diagnosis not present

## 2017-12-26 DIAGNOSIS — J309 Allergic rhinitis, unspecified: Secondary | ICD-10-CM | POA: Diagnosis not present

## 2017-12-26 DIAGNOSIS — R0600 Dyspnea, unspecified: Secondary | ICD-10-CM | POA: Diagnosis not present

## 2017-12-26 DIAGNOSIS — G2581 Restless legs syndrome: Secondary | ICD-10-CM | POA: Diagnosis not present

## 2017-12-26 DIAGNOSIS — G4733 Obstructive sleep apnea (adult) (pediatric): Secondary | ICD-10-CM | POA: Diagnosis not present

## 2017-12-28 DIAGNOSIS — G4733 Obstructive sleep apnea (adult) (pediatric): Secondary | ICD-10-CM | POA: Diagnosis not present

## 2017-12-28 DIAGNOSIS — G4730 Sleep apnea, unspecified: Secondary | ICD-10-CM | POA: Diagnosis not present

## 2018-01-06 DIAGNOSIS — J45909 Unspecified asthma, uncomplicated: Secondary | ICD-10-CM | POA: Diagnosis not present

## 2018-01-09 DIAGNOSIS — K2270 Barrett's esophagus without dysplasia: Secondary | ICD-10-CM | POA: Diagnosis not present

## 2018-01-09 DIAGNOSIS — K317 Polyp of stomach and duodenum: Secondary | ICD-10-CM | POA: Diagnosis not present

## 2018-01-09 DIAGNOSIS — K2930 Chronic superficial gastritis without bleeding: Secondary | ICD-10-CM | POA: Diagnosis not present

## 2018-02-06 DIAGNOSIS — J45909 Unspecified asthma, uncomplicated: Secondary | ICD-10-CM | POA: Diagnosis not present

## 2018-03-08 DIAGNOSIS — J45909 Unspecified asthma, uncomplicated: Secondary | ICD-10-CM | POA: Diagnosis not present

## 2018-03-27 DIAGNOSIS — J4530 Mild persistent asthma, uncomplicated: Secondary | ICD-10-CM | POA: Diagnosis not present

## 2018-03-27 DIAGNOSIS — G2581 Restless legs syndrome: Secondary | ICD-10-CM | POA: Diagnosis not present

## 2018-03-27 DIAGNOSIS — F411 Generalized anxiety disorder: Secondary | ICD-10-CM | POA: Diagnosis not present

## 2018-03-27 DIAGNOSIS — J309 Allergic rhinitis, unspecified: Secondary | ICD-10-CM | POA: Diagnosis not present

## 2018-03-27 DIAGNOSIS — G4733 Obstructive sleep apnea (adult) (pediatric): Secondary | ICD-10-CM | POA: Diagnosis not present

## 2018-04-02 DIAGNOSIS — Z6833 Body mass index (BMI) 33.0-33.9, adult: Secondary | ICD-10-CM | POA: Diagnosis not present

## 2018-04-02 DIAGNOSIS — K2270 Barrett's esophagus without dysplasia: Secondary | ICD-10-CM | POA: Diagnosis not present

## 2018-04-02 DIAGNOSIS — E669 Obesity, unspecified: Secondary | ICD-10-CM | POA: Diagnosis not present

## 2018-04-02 DIAGNOSIS — K760 Fatty (change of) liver, not elsewhere classified: Secondary | ICD-10-CM | POA: Diagnosis not present

## 2018-04-02 DIAGNOSIS — E559 Vitamin D deficiency, unspecified: Secondary | ICD-10-CM | POA: Diagnosis not present

## 2018-04-02 DIAGNOSIS — H6093 Unspecified otitis externa, bilateral: Secondary | ICD-10-CM | POA: Diagnosis not present

## 2018-04-02 DIAGNOSIS — J309 Allergic rhinitis, unspecified: Secondary | ICD-10-CM | POA: Diagnosis not present

## 2018-04-02 DIAGNOSIS — E785 Hyperlipidemia, unspecified: Secondary | ICD-10-CM | POA: Diagnosis not present

## 2018-04-02 DIAGNOSIS — M1990 Unspecified osteoarthritis, unspecified site: Secondary | ICD-10-CM | POA: Diagnosis not present

## 2018-04-08 DIAGNOSIS — J45909 Unspecified asthma, uncomplicated: Secondary | ICD-10-CM | POA: Diagnosis not present

## 2018-05-08 DIAGNOSIS — J45909 Unspecified asthma, uncomplicated: Secondary | ICD-10-CM | POA: Diagnosis not present

## 2018-05-10 DIAGNOSIS — E669 Obesity, unspecified: Secondary | ICD-10-CM | POA: Diagnosis not present

## 2018-05-10 DIAGNOSIS — E559 Vitamin D deficiency, unspecified: Secondary | ICD-10-CM | POA: Diagnosis not present

## 2018-05-10 DIAGNOSIS — I10 Essential (primary) hypertension: Secondary | ICD-10-CM | POA: Diagnosis not present

## 2018-05-10 DIAGNOSIS — N760 Acute vaginitis: Secondary | ICD-10-CM | POA: Diagnosis not present

## 2018-05-10 DIAGNOSIS — Z6833 Body mass index (BMI) 33.0-33.9, adult: Secondary | ICD-10-CM | POA: Diagnosis not present

## 2018-06-08 DIAGNOSIS — J45909 Unspecified asthma, uncomplicated: Secondary | ICD-10-CM | POA: Diagnosis not present

## 2018-06-11 DIAGNOSIS — Z833 Family history of diabetes mellitus: Secondary | ICD-10-CM | POA: Diagnosis not present

## 2018-06-11 DIAGNOSIS — I10 Essential (primary) hypertension: Secondary | ICD-10-CM | POA: Diagnosis not present

## 2018-06-11 DIAGNOSIS — N760 Acute vaginitis: Secondary | ICD-10-CM | POA: Diagnosis not present

## 2018-06-11 DIAGNOSIS — Z6834 Body mass index (BMI) 34.0-34.9, adult: Secondary | ICD-10-CM | POA: Diagnosis not present

## 2018-06-11 DIAGNOSIS — Z139 Encounter for screening, unspecified: Secondary | ICD-10-CM | POA: Diagnosis not present

## 2018-06-11 DIAGNOSIS — Z2821 Immunization not carried out because of patient refusal: Secondary | ICD-10-CM | POA: Diagnosis not present

## 2018-06-11 DIAGNOSIS — Z79899 Other long term (current) drug therapy: Secondary | ICD-10-CM | POA: Diagnosis not present

## 2018-06-11 DIAGNOSIS — E785 Hyperlipidemia, unspecified: Secondary | ICD-10-CM | POA: Diagnosis not present

## 2018-06-11 DIAGNOSIS — M1990 Unspecified osteoarthritis, unspecified site: Secondary | ICD-10-CM | POA: Diagnosis not present

## 2018-06-11 DIAGNOSIS — E559 Vitamin D deficiency, unspecified: Secondary | ICD-10-CM | POA: Diagnosis not present

## 2018-06-11 DIAGNOSIS — N952 Postmenopausal atrophic vaginitis: Secondary | ICD-10-CM | POA: Diagnosis not present

## 2018-06-25 DIAGNOSIS — F411 Generalized anxiety disorder: Secondary | ICD-10-CM | POA: Diagnosis not present

## 2018-06-25 DIAGNOSIS — J4530 Mild persistent asthma, uncomplicated: Secondary | ICD-10-CM | POA: Diagnosis not present

## 2018-06-25 DIAGNOSIS — J309 Allergic rhinitis, unspecified: Secondary | ICD-10-CM | POA: Diagnosis not present

## 2018-06-25 DIAGNOSIS — G2581 Restless legs syndrome: Secondary | ICD-10-CM | POA: Diagnosis not present

## 2018-06-25 DIAGNOSIS — G4733 Obstructive sleep apnea (adult) (pediatric): Secondary | ICD-10-CM | POA: Diagnosis not present

## 2018-06-27 DIAGNOSIS — R05 Cough: Secondary | ICD-10-CM | POA: Diagnosis not present

## 2018-06-27 DIAGNOSIS — J209 Acute bronchitis, unspecified: Secondary | ICD-10-CM | POA: Diagnosis not present

## 2018-06-27 DIAGNOSIS — Z6834 Body mass index (BMI) 34.0-34.9, adult: Secondary | ICD-10-CM | POA: Diagnosis not present

## 2018-06-27 DIAGNOSIS — J019 Acute sinusitis, unspecified: Secondary | ICD-10-CM | POA: Diagnosis not present

## 2018-06-27 DIAGNOSIS — J45909 Unspecified asthma, uncomplicated: Secondary | ICD-10-CM | POA: Diagnosis not present

## 2018-07-09 DIAGNOSIS — J45909 Unspecified asthma, uncomplicated: Secondary | ICD-10-CM | POA: Diagnosis not present

## 2018-07-09 DIAGNOSIS — G4733 Obstructive sleep apnea (adult) (pediatric): Secondary | ICD-10-CM | POA: Diagnosis not present

## 2018-07-25 DIAGNOSIS — D171 Benign lipomatous neoplasm of skin and subcutaneous tissue of trunk: Secondary | ICD-10-CM | POA: Diagnosis not present

## 2018-07-25 DIAGNOSIS — B373 Candidiasis of vulva and vagina: Secondary | ICD-10-CM | POA: Diagnosis not present

## 2018-07-25 DIAGNOSIS — I10 Essential (primary) hypertension: Secondary | ICD-10-CM | POA: Diagnosis not present

## 2018-07-25 DIAGNOSIS — Z6834 Body mass index (BMI) 34.0-34.9, adult: Secondary | ICD-10-CM | POA: Diagnosis not present

## 2018-07-25 DIAGNOSIS — R300 Dysuria: Secondary | ICD-10-CM | POA: Diagnosis not present

## 2018-07-25 DIAGNOSIS — F329 Major depressive disorder, single episode, unspecified: Secondary | ICD-10-CM | POA: Diagnosis not present

## 2018-07-30 DIAGNOSIS — Z79899 Other long term (current) drug therapy: Secondary | ICD-10-CM | POA: Diagnosis not present

## 2018-07-30 DIAGNOSIS — G4733 Obstructive sleep apnea (adult) (pediatric): Secondary | ICD-10-CM | POA: Diagnosis not present

## 2018-07-30 DIAGNOSIS — G2581 Restless legs syndrome: Secondary | ICD-10-CM | POA: Diagnosis not present

## 2018-07-30 DIAGNOSIS — F411 Generalized anxiety disorder: Secondary | ICD-10-CM | POA: Diagnosis not present

## 2018-07-30 DIAGNOSIS — R05 Cough: Secondary | ICD-10-CM | POA: Diagnosis not present

## 2018-07-30 DIAGNOSIS — J4531 Mild persistent asthma with (acute) exacerbation: Secondary | ICD-10-CM | POA: Diagnosis not present

## 2018-07-30 DIAGNOSIS — J309 Allergic rhinitis, unspecified: Secondary | ICD-10-CM | POA: Diagnosis not present

## 2018-07-30 DIAGNOSIS — R0600 Dyspnea, unspecified: Secondary | ICD-10-CM | POA: Diagnosis not present

## 2018-08-03 DIAGNOSIS — G4733 Obstructive sleep apnea (adult) (pediatric): Secondary | ICD-10-CM | POA: Diagnosis not present

## 2018-08-03 DIAGNOSIS — J309 Allergic rhinitis, unspecified: Secondary | ICD-10-CM | POA: Diagnosis not present

## 2018-08-03 DIAGNOSIS — G2581 Restless legs syndrome: Secondary | ICD-10-CM | POA: Diagnosis not present

## 2018-08-03 DIAGNOSIS — R05 Cough: Secondary | ICD-10-CM | POA: Diagnosis not present

## 2018-08-03 DIAGNOSIS — R0600 Dyspnea, unspecified: Secondary | ICD-10-CM | POA: Diagnosis not present

## 2018-08-03 DIAGNOSIS — J4531 Mild persistent asthma with (acute) exacerbation: Secondary | ICD-10-CM | POA: Diagnosis not present

## 2018-08-10 DIAGNOSIS — G2581 Restless legs syndrome: Secondary | ICD-10-CM | POA: Diagnosis not present

## 2018-08-10 DIAGNOSIS — J301 Allergic rhinitis due to pollen: Secondary | ICD-10-CM | POA: Diagnosis not present

## 2018-08-10 DIAGNOSIS — R05 Cough: Secondary | ICD-10-CM | POA: Diagnosis not present

## 2018-08-10 DIAGNOSIS — G4733 Obstructive sleep apnea (adult) (pediatric): Secondary | ICD-10-CM | POA: Diagnosis not present

## 2018-08-10 DIAGNOSIS — J4531 Mild persistent asthma with (acute) exacerbation: Secondary | ICD-10-CM | POA: Diagnosis not present

## 2018-08-10 DIAGNOSIS — R0600 Dyspnea, unspecified: Secondary | ICD-10-CM | POA: Diagnosis not present

## 2018-08-10 DIAGNOSIS — F411 Generalized anxiety disorder: Secondary | ICD-10-CM | POA: Diagnosis not present

## 2018-08-16 DIAGNOSIS — J301 Allergic rhinitis due to pollen: Secondary | ICD-10-CM | POA: Diagnosis not present

## 2018-08-16 DIAGNOSIS — J4531 Mild persistent asthma with (acute) exacerbation: Secondary | ICD-10-CM | POA: Diagnosis not present

## 2018-08-16 DIAGNOSIS — R0600 Dyspnea, unspecified: Secondary | ICD-10-CM | POA: Diagnosis not present

## 2018-08-16 DIAGNOSIS — R05 Cough: Secondary | ICD-10-CM | POA: Diagnosis not present

## 2018-08-16 DIAGNOSIS — G4733 Obstructive sleep apnea (adult) (pediatric): Secondary | ICD-10-CM | POA: Diagnosis not present

## 2018-08-16 DIAGNOSIS — G2581 Restless legs syndrome: Secondary | ICD-10-CM | POA: Diagnosis not present

## 2018-08-20 DIAGNOSIS — J4530 Mild persistent asthma, uncomplicated: Secondary | ICD-10-CM | POA: Diagnosis not present

## 2018-08-20 DIAGNOSIS — G4733 Obstructive sleep apnea (adult) (pediatric): Secondary | ICD-10-CM | POA: Diagnosis not present

## 2018-08-20 DIAGNOSIS — J301 Allergic rhinitis due to pollen: Secondary | ICD-10-CM | POA: Diagnosis not present

## 2018-08-20 DIAGNOSIS — G2581 Restless legs syndrome: Secondary | ICD-10-CM | POA: Diagnosis not present

## 2018-08-21 DIAGNOSIS — R160 Hepatomegaly, not elsewhere classified: Secondary | ICD-10-CM | POA: Diagnosis not present

## 2018-08-27 DIAGNOSIS — G2581 Restless legs syndrome: Secondary | ICD-10-CM | POA: Diagnosis not present

## 2018-08-27 DIAGNOSIS — J301 Allergic rhinitis due to pollen: Secondary | ICD-10-CM | POA: Diagnosis not present

## 2018-08-27 DIAGNOSIS — G4733 Obstructive sleep apnea (adult) (pediatric): Secondary | ICD-10-CM | POA: Diagnosis not present

## 2018-08-27 DIAGNOSIS — F411 Generalized anxiety disorder: Secondary | ICD-10-CM | POA: Diagnosis not present

## 2018-08-27 DIAGNOSIS — J4530 Mild persistent asthma, uncomplicated: Secondary | ICD-10-CM | POA: Diagnosis not present

## 2018-08-27 DIAGNOSIS — R1011 Right upper quadrant pain: Secondary | ICD-10-CM | POA: Diagnosis not present

## 2018-09-05 DIAGNOSIS — J309 Allergic rhinitis, unspecified: Secondary | ICD-10-CM | POA: Diagnosis not present

## 2018-09-05 DIAGNOSIS — G4730 Sleep apnea, unspecified: Secondary | ICD-10-CM | POA: Diagnosis not present

## 2018-09-05 DIAGNOSIS — B373 Candidiasis of vulva and vagina: Secondary | ICD-10-CM | POA: Diagnosis not present

## 2018-09-05 DIAGNOSIS — F418 Other specified anxiety disorders: Secondary | ICD-10-CM | POA: Diagnosis not present

## 2018-09-05 DIAGNOSIS — R110 Nausea: Secondary | ICD-10-CM | POA: Diagnosis not present

## 2018-09-05 DIAGNOSIS — J45909 Unspecified asthma, uncomplicated: Secondary | ICD-10-CM | POA: Diagnosis not present

## 2018-09-05 DIAGNOSIS — I10 Essential (primary) hypertension: Secondary | ICD-10-CM | POA: Diagnosis not present

## 2018-09-05 DIAGNOSIS — K760 Fatty (change of) liver, not elsewhere classified: Secondary | ICD-10-CM | POA: Diagnosis not present

## 2018-09-25 DIAGNOSIS — Z79899 Other long term (current) drug therapy: Secondary | ICD-10-CM | POA: Diagnosis not present

## 2018-09-25 DIAGNOSIS — J301 Allergic rhinitis due to pollen: Secondary | ICD-10-CM | POA: Diagnosis not present

## 2018-09-25 DIAGNOSIS — G4733 Obstructive sleep apnea (adult) (pediatric): Secondary | ICD-10-CM | POA: Diagnosis not present

## 2018-09-25 DIAGNOSIS — F411 Generalized anxiety disorder: Secondary | ICD-10-CM | POA: Diagnosis not present

## 2018-09-25 DIAGNOSIS — J4530 Mild persistent asthma, uncomplicated: Secondary | ICD-10-CM | POA: Diagnosis not present

## 2018-09-26 DIAGNOSIS — M7989 Other specified soft tissue disorders: Secondary | ICD-10-CM | POA: Diagnosis not present

## 2018-09-26 DIAGNOSIS — F418 Other specified anxiety disorders: Secondary | ICD-10-CM | POA: Diagnosis not present

## 2018-09-26 DIAGNOSIS — J45909 Unspecified asthma, uncomplicated: Secondary | ICD-10-CM | POA: Diagnosis not present

## 2018-09-26 DIAGNOSIS — K219 Gastro-esophageal reflux disease without esophagitis: Secondary | ICD-10-CM | POA: Diagnosis not present

## 2018-09-26 DIAGNOSIS — R1013 Epigastric pain: Secondary | ICD-10-CM | POA: Diagnosis not present

## 2018-09-26 DIAGNOSIS — E559 Vitamin D deficiency, unspecified: Secondary | ICD-10-CM | POA: Diagnosis not present

## 2018-09-26 DIAGNOSIS — M1990 Unspecified osteoarthritis, unspecified site: Secondary | ICD-10-CM | POA: Diagnosis not present

## 2018-10-05 DIAGNOSIS — J4530 Mild persistent asthma, uncomplicated: Secondary | ICD-10-CM | POA: Diagnosis not present

## 2018-10-05 DIAGNOSIS — J301 Allergic rhinitis due to pollen: Secondary | ICD-10-CM | POA: Diagnosis not present

## 2018-10-05 DIAGNOSIS — G2581 Restless legs syndrome: Secondary | ICD-10-CM | POA: Diagnosis not present

## 2018-10-05 DIAGNOSIS — G4733 Obstructive sleep apnea (adult) (pediatric): Secondary | ICD-10-CM | POA: Diagnosis not present

## 2018-10-05 DIAGNOSIS — F411 Generalized anxiety disorder: Secondary | ICD-10-CM | POA: Diagnosis not present

## 2018-12-07 DIAGNOSIS — Z0000 Encounter for general adult medical examination without abnormal findings: Secondary | ICD-10-CM | POA: Diagnosis not present

## 2018-12-07 DIAGNOSIS — E785 Hyperlipidemia, unspecified: Secondary | ICD-10-CM | POA: Diagnosis not present

## 2018-12-07 DIAGNOSIS — Z1331 Encounter for screening for depression: Secondary | ICD-10-CM | POA: Diagnosis not present

## 2018-12-07 DIAGNOSIS — Z9181 History of falling: Secondary | ICD-10-CM | POA: Diagnosis not present

## 2018-12-07 DIAGNOSIS — Z6834 Body mass index (BMI) 34.0-34.9, adult: Secondary | ICD-10-CM | POA: Diagnosis not present

## 2018-12-26 DIAGNOSIS — J45909 Unspecified asthma, uncomplicated: Secondary | ICD-10-CM | POA: Diagnosis not present

## 2018-12-26 DIAGNOSIS — E559 Vitamin D deficiency, unspecified: Secondary | ICD-10-CM | POA: Diagnosis not present

## 2018-12-26 DIAGNOSIS — F418 Other specified anxiety disorders: Secondary | ICD-10-CM | POA: Diagnosis not present

## 2018-12-26 DIAGNOSIS — Z79899 Other long term (current) drug therapy: Secondary | ICD-10-CM | POA: Diagnosis not present

## 2018-12-26 DIAGNOSIS — E785 Hyperlipidemia, unspecified: Secondary | ICD-10-CM | POA: Diagnosis not present

## 2018-12-26 DIAGNOSIS — I10 Essential (primary) hypertension: Secondary | ICD-10-CM | POA: Diagnosis not present

## 2018-12-26 DIAGNOSIS — M7989 Other specified soft tissue disorders: Secondary | ICD-10-CM | POA: Diagnosis not present

## 2018-12-28 DIAGNOSIS — Z79899 Other long term (current) drug therapy: Secondary | ICD-10-CM | POA: Diagnosis not present

## 2018-12-28 DIAGNOSIS — E785 Hyperlipidemia, unspecified: Secondary | ICD-10-CM | POA: Diagnosis not present

## 2018-12-28 DIAGNOSIS — E559 Vitamin D deficiency, unspecified: Secondary | ICD-10-CM | POA: Diagnosis not present

## 2019-01-03 DIAGNOSIS — E559 Vitamin D deficiency, unspecified: Secondary | ICD-10-CM | POA: Diagnosis not present

## 2019-01-03 DIAGNOSIS — Z6834 Body mass index (BMI) 34.0-34.9, adult: Secondary | ICD-10-CM | POA: Diagnosis not present

## 2019-01-03 DIAGNOSIS — J309 Allergic rhinitis, unspecified: Secondary | ICD-10-CM | POA: Diagnosis not present

## 2019-01-30 DIAGNOSIS — R51 Headache: Secondary | ICD-10-CM | POA: Diagnosis not present

## 2019-01-30 DIAGNOSIS — E559 Vitamin D deficiency, unspecified: Secondary | ICD-10-CM | POA: Diagnosis not present

## 2019-01-30 DIAGNOSIS — M7989 Other specified soft tissue disorders: Secondary | ICD-10-CM | POA: Diagnosis not present

## 2019-01-30 DIAGNOSIS — F329 Major depressive disorder, single episode, unspecified: Secondary | ICD-10-CM | POA: Diagnosis not present

## 2019-01-30 DIAGNOSIS — J45909 Unspecified asthma, uncomplicated: Secondary | ICD-10-CM | POA: Diagnosis not present

## 2019-01-30 DIAGNOSIS — F419 Anxiety disorder, unspecified: Secondary | ICD-10-CM | POA: Diagnosis not present

## 2019-01-30 DIAGNOSIS — I10 Essential (primary) hypertension: Secondary | ICD-10-CM | POA: Diagnosis not present

## 2019-02-28 DIAGNOSIS — F419 Anxiety disorder, unspecified: Secondary | ICD-10-CM | POA: Diagnosis not present

## 2019-02-28 DIAGNOSIS — L853 Xerosis cutis: Secondary | ICD-10-CM | POA: Diagnosis not present

## 2019-02-28 DIAGNOSIS — I10 Essential (primary) hypertension: Secondary | ICD-10-CM | POA: Diagnosis not present

## 2019-02-28 DIAGNOSIS — R197 Diarrhea, unspecified: Secondary | ICD-10-CM | POA: Diagnosis not present

## 2019-02-28 DIAGNOSIS — J309 Allergic rhinitis, unspecified: Secondary | ICD-10-CM | POA: Diagnosis not present

## 2019-02-28 DIAGNOSIS — Z2821 Immunization not carried out because of patient refusal: Secondary | ICD-10-CM | POA: Diagnosis not present

## 2019-03-15 DIAGNOSIS — K219 Gastro-esophageal reflux disease without esophagitis: Secondary | ICD-10-CM | POA: Diagnosis not present

## 2019-03-15 DIAGNOSIS — G894 Chronic pain syndrome: Secondary | ICD-10-CM | POA: Diagnosis not present

## 2019-03-15 DIAGNOSIS — F419 Anxiety disorder, unspecified: Secondary | ICD-10-CM | POA: Diagnosis not present

## 2019-03-15 DIAGNOSIS — I10 Essential (primary) hypertension: Secondary | ICD-10-CM | POA: Diagnosis not present

## 2019-03-15 DIAGNOSIS — K5730 Diverticulosis of large intestine without perforation or abscess without bleeding: Secondary | ICD-10-CM | POA: Diagnosis not present

## 2019-03-15 DIAGNOSIS — K760 Fatty (change of) liver, not elsewhere classified: Secondary | ICD-10-CM | POA: Diagnosis not present

## 2019-03-15 DIAGNOSIS — R079 Chest pain, unspecified: Secondary | ICD-10-CM | POA: Diagnosis not present

## 2019-03-15 DIAGNOSIS — R21 Rash and other nonspecific skin eruption: Secondary | ICD-10-CM | POA: Diagnosis not present

## 2019-03-19 ENCOUNTER — Ambulatory Visit: Payer: Self-pay | Admitting: Cardiology

## 2019-03-19 DIAGNOSIS — F419 Anxiety disorder, unspecified: Secondary | ICD-10-CM | POA: Diagnosis not present

## 2019-03-19 DIAGNOSIS — R3129 Other microscopic hematuria: Secondary | ICD-10-CM | POA: Diagnosis not present

## 2019-03-19 DIAGNOSIS — R21 Rash and other nonspecific skin eruption: Secondary | ICD-10-CM | POA: Diagnosis not present

## 2019-03-19 DIAGNOSIS — R300 Dysuria: Secondary | ICD-10-CM | POA: Diagnosis not present

## 2019-03-19 DIAGNOSIS — E876 Hypokalemia: Secondary | ICD-10-CM | POA: Diagnosis not present

## 2019-03-19 DIAGNOSIS — Z09 Encounter for follow-up examination after completed treatment for conditions other than malignant neoplasm: Secondary | ICD-10-CM | POA: Diagnosis not present

## 2019-03-25 ENCOUNTER — Ambulatory Visit (INDEPENDENT_AMBULATORY_CARE_PROVIDER_SITE_OTHER): Payer: Medicare HMO | Admitting: Cardiology

## 2019-03-25 ENCOUNTER — Encounter: Payer: Self-pay | Admitting: Cardiology

## 2019-03-25 ENCOUNTER — Ambulatory Visit (INDEPENDENT_AMBULATORY_CARE_PROVIDER_SITE_OTHER): Payer: Medicare HMO

## 2019-03-25 ENCOUNTER — Other Ambulatory Visit: Payer: Self-pay

## 2019-03-25 VITALS — BP 148/82 | Ht 61.0 in | Wt 189.0 lb

## 2019-03-25 DIAGNOSIS — R079 Chest pain, unspecified: Secondary | ICD-10-CM

## 2019-03-25 DIAGNOSIS — Z01812 Encounter for preprocedural laboratory examination: Secondary | ICD-10-CM

## 2019-03-25 DIAGNOSIS — E669 Obesity, unspecified: Secondary | ICD-10-CM | POA: Diagnosis not present

## 2019-03-25 DIAGNOSIS — R0600 Dyspnea, unspecified: Secondary | ICD-10-CM | POA: Diagnosis not present

## 2019-03-25 DIAGNOSIS — R002 Palpitations: Secondary | ICD-10-CM

## 2019-03-25 MED ORDER — CARVEDILOL 6.25 MG PO TABS: 6.2500 mg | ORAL_TABLET | Freq: Two times a day (BID) | ORAL | 1 refills | Status: DC

## 2019-03-25 NOTE — Patient Instructions (Signed)
Medication Instructions:  Your physician has recommended you make the following change in your medication:   START:  COREG( carvedilol) 6.25 mg Take 1 tab twice daily with meals  *If you need a refill on your cardiac medications before your next appointment, please call your pharmacy*  Lab Work: Your physician recommends that you return for lab work in:   3-7 days prior to CT:BMP  If you have labs (blood work) drawn today and your tests are completely normal, you will receive your results only by:  [place] (if you have MyChart) OR  A paper copy in the mail If you have any lab test that is abnormal or we need to change your treatment, we will call you to review the results.  Testing/Procedures: Your physician has requested that you have an echocardiogram. Echocardiography is a painless test that uses sound waves to create images of your heart. It provides your doctor with information about the size and shape of your heart and how well your hearts chambers and valves are working. This procedure takes approximately one hour. There are no restrictions for this procedure.  A zio monitor was placed today. It will remain on for 7 days. You will then return monitor and event diary in provided box. It takes 1-2 weeks for report to be downloaded and returned to [place]. We will call you with the results. If monitor falls off or has orange flashing light, please call [employer] for further instructions.   Your physician has requested that you have cardiac CT. Cardiac computed tomography (CT) is a painless test that uses an x-ray machine to take clear, detailed pictures of your heart. For further information please visit [email or web address]. Please follow instruction sheet as given.  Your cardiac CT will be scheduled at one of the below locations:   [facility] [address] [place], [place] [address] [phone]  If scheduled at [facility], please arrive at the [facility]  main entrance of [facility] 30-45 minutes prior to test start time. Proceed to the [facility] Radiology Department (first floor) to check-in and test prep.  Please follow these instructions carefully (unless otherwise directed):  On the Night Before the Test:  Be sure to Drink plenty of water.  Do not consume any caffeinated/decaffeinated beverages or chocolate 12 hours prior to your test.  Do not take any antihistamines 12 hours prior to your test.  On the Day of the Test:  Drink plenty of water. Do not drink any water within one hour of the test.  Do not eat any food 4 hours prior to the test.  You may take your regular medications prior to the test.   Take carvedilol (Coreg) two hours prior to test.  FEMALES- please wear underwire-free bra if available                   -If HR is less than 55 BPM- No Beta Blocker(carvedilol)                -IF HR is greater than 55 BPM and patient is less than or equal to 45 yrs old COREG(carvedilol) 6.25 mg       After the Test:  Drink plenty of water.  After receiving IV contrast, you may experience a mild flushed feeling. This is normal.  On occasion, you may experience a mild rash up to 24 hours after the test. This is not dangerous. If this occurs, you can take Benadryl 25  mg and increase your fluid intake.  If you experience trouble breathing, this can be serious. If it is severe call 911 IMMEDIATELY. If it is mild, please call our office.   Once we have confirmed authorization from your insurance company, we will call you to set up a date and time for your test.   For non-scheduling related questions, please contact the cardiac imaging nurse navigator should you have any questions/concerns: Marchia Bond, RN Navigator Cardiac Imaging [facility] [phone] Office    Follow-Up: At [facility], you and your health needs are our priority.  As part of our continuing mission to provide you  with exceptional heart care, we have created designated Provider Care Teams.  These Care Teams include your primary Cardiologist (physician) and Advanced Practice Providers (APPs -  Physician Assistants and Nurse Practitioners) who all work together to provide you with the care you need, when you need it.  Your next appointment:   1 month  The format for your next appointment:   In Person  Provider:   Berniece Salines, DO  Other Instructions Echo  Cardiac CT Angiogram  A cardiac CT angiogram is a procedure to look at the heart and the area around the heart. It may be done to help find the cause of chest pains or other symptoms of heart disease. During this procedure, a large X-ray machine, called a CT scanner, takes detailed pictures of the heart and the surrounding area after a dye (contrast material) has been injected into blood vessels in the area. The procedure is also sometimes called a coronary CT angiogram, coronary artery scanning, or CTA. A cardiac CT angiogram allows the health care provider to see how well blood is flowing to and from the heart. The health care provider will be able to see if there are any problems, such as:  Blockage or narrowing of the coronary arteries in the heart.  Fluid around the heart.  Signs of weakness or disease in the muscles, valves, and tissues of the heart. Tell a health care provider about:  Any allergies you have. This is especially important if you have had a previous allergic reaction to contrast dye.  All medicines you are taking, including vitamins, herbs, eye drops, creams, and over-the-counter medicines.  Any blood disorders you have.  Any surgeries you have had.  Any medical conditions you have.  Whether you are pregnant or may be pregnant.  Any anxiety disorders, chronic pain, or other conditions you have that may increase your stress or prevent you from lying still. What are the risks? Generally, this is a safe procedure.  However, problems may occur, including:  Bleeding.  Infection.  Allergic reactions to medicines or dyes.  Damage to other structures or organs.  Kidney damage from the dye or contrast that is used.  Increased risk of cancer from radiation exposure. This risk is low. Talk with your health care provider about: ? The risks and benefits of testing. ? How you can receive the lowest dose of radiation. What happens before the procedure?  Wear comfortable clothing and remove any jewelry, glasses, dentures, and hearing aids.  Follow instructions from your health care provider about eating and drinking. This may include: ? For 12 hours before the test -- avoid caffeine. This includes tea, coffee, soda, energy drinks, and diet pills. Drink plenty of water or other fluids that do not have caffeine in them. Being well-hydrated can prevent complications. ? For 4-6 hours before the  test -- stop eating and drinking. The contrast dye can cause nausea, but this is less likely if your stomach is empty.  Ask your health care provider about changing or stopping your regular medicines. This is especially important if you are taking diabetes medicines, blood thinners, or medicines to treat erectile dysfunction. What happens during the procedure?  Hair on your chest may need to be removed so that small sticky patches called electrodes can be placed on your chest. These will transmit information that helps to monitor your heart during the test.  An IV tube will be inserted into one of your veins.  You might be given a medicine to control your heart rate during the test. This will help to ensure that good images are obtained.  You will be asked to lie on an exam table. This table will slide in and out of the CT machine during the procedure.  Contrast dye will be injected into the IV tube. You might feel warm, or you may get a metallic taste in your mouth.  You will be given a medicine (nitroglycerin) to  relax (dilate) the arteries in your heart.  The table that you are lying on will move into the CT machine tunnel for the scan.  The person running the machine will give you instructions while the scans are being done. You may be asked to: ? Keep your arms above your head. ? Hold your breath. ? Stay very still, even if the table is moving.  When the scanning is complete, you will be moved out of the machine.  The IV tube will be removed. The procedure may vary among health care providers and hospitals. What happens after the procedure?  You might feel warm, or you may get a metallic taste in your mouth from the contrast dye.  You may have a headache from the nitroglycerin.  After the procedure, drink water or other fluids to wash (flush) the contrast material out of your body.  Contact a health care provider if you have any symptoms of allergy to the contrast. These symptoms include: ? Shortness of breath. ? Rash or hives. ? A racing heartbeat.  Most people can return to their normal activities right after the procedure. Ask your health care provider what activities are safe for you.  It is up to you to get the results of your procedure. Ask your health care provider, or the department that is doing the procedure, when your results will be ready. Summary  A cardiac CT angiogram is a procedure to look at the heart and the area around the heart. It may be done to help find the cause of chest pains or other symptoms of heart disease.  During this procedure, a large X-ray machine, called a CT scanner, takes detailed pictures of the heart and the surrounding area after a dye (contrast material) has been injected into blood vessels in the area.  Ask your health care provider about changing or stopping your regular medicines before the procedure. This is especially important if you are taking diabetes medicines, blood thinners, or medicines to treat erectile dysfunction.  After the  procedure, drink water or other fluids to wash (flush) the contrast material out of your body. This information is not intended to replace advice given to you by your health care provider. Make sure you discuss any questions you have with your health care provider. Document Released: 04/21/2008 Document Revised: 04/21/2017 Document Reviewed: 03/28/2016 Elsevier Patient Education  2020 [employer].  Echocardiogram An echocardiogram is a procedure that uses painless sound waves (ultrasound) to produce an image of the heart. Images from an echocardiogram can provide important information about:  Signs of coronary artery disease (CAD).  Aneurysm detection. An aneurysm is a weak or damaged part of an artery wall that bulges out from the normal force of blood pumping through the body.  Heart size and shape. Changes in the size or shape of the heart can be associated with certain conditions, including heart failure, aneurysm, and CAD.  Heart muscle function.  Heart valve function.  Signs of a past heart attack.  Fluid buildup around the heart.  Thickening of the heart muscle.  A tumor or infectious growth around the heart valves. Tell a health care provider about:  Any allergies you have.  All medicines you are taking, including vitamins, herbs, eye drops, creams, and over-the-counter medicines.  Any blood disorders you have.  Any surgeries you have had.  Any medical conditions you have.  Whether you are pregnant or may be pregnant. What are the risks? Generally, this is a safe procedure. However, problems may occur, including:  Allergic reaction to dye (contrast) that may be used during the procedure. What happens before the procedure? No specific preparation is needed. You may eat and drink normally. What happens during the procedure?   An IV tube may be inserted into one of your veins.  You may receive contrast through this tube. A contrast is an injection that improves  the quality of the pictures from your heart.  A gel will be applied to your chest.  A wand-like tool (transducer) will be moved over your chest. The gel will help to transmit the sound waves from the transducer.  The sound waves will harmlessly bounce off of your heart to allow the heart images to be captured in real-time motion. The images will be recorded on a computer. The procedure may vary among health care providers and hospitals. What happens after the procedure?  You may return to your normal, everyday life, including diet, activities, and medicines, unless your health care provider tells you not to do that. Summary  An echocardiogram is a procedure that uses painless sound waves (ultrasound) to produce an image of the heart.  Images from an echocardiogram can provide important information about the size and shape of your heart, heart muscle function, heart valve function, and fluid buildup around your heart.  You do not need to do anything to prepare before this procedure. You may eat and drink normally.  After the echocardiogram is completed, you may return to your normal, everyday life, unless your health care provider tells you not to do that. This information is not intended to replace advice given to you by your health care provider. Make sure you discuss any questions you have with your health care provider. Document Released: 05/06/2000 Document Revised: 08/30/2018 Document Reviewed: 06/11/2016 Elsevier Patient Education  2020 [employer].
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
## 2019-03-28 DIAGNOSIS — R002 Palpitations: Secondary | ICD-10-CM

## 2019-03-29 ENCOUNTER — Telehealth: Payer: Self-pay | Admitting: Cardiology

## 2019-03-29 NOTE — Telephone Encounter (Signed)
Her monitor has her itching and burning, she took it off, she looks like she's been branded

## 2019-03-29 NOTE — Telephone Encounter (Signed)
Telephone call to patient. States had to take off monitor today after wearing 4 days due to itching and burning. States was able to get 4 pages written in the book. Instructed to mail the monitor back to the company. Patient verbalized understanding.Marland Kitchen

## 2019-04-05 ENCOUNTER — Ambulatory Visit: Payer: Self-pay | Admitting: Cardiology

## 2019-04-06 DIAGNOSIS — R002 Palpitations: Secondary | ICD-10-CM | POA: Diagnosis not present

## 2019-04-15 ENCOUNTER — Telehealth: Payer: Self-pay | Admitting: *Deleted

## 2019-04-15 NOTE — Telephone Encounter (Signed)
-----   Message from Berniece Salines, DO sent at 04/14/2019  5:05 PM EST ----- Normal monitor

## 2019-04-15 NOTE — Telephone Encounter (Signed)
Telephone call to patient.Left message that monitor was normal and to call with any questions.

## 2019-04-23 ENCOUNTER — Other Ambulatory Visit: Payer: Self-pay

## 2019-04-23 ENCOUNTER — Encounter: Payer: Self-pay | Admitting: Cardiology

## 2019-04-23 ENCOUNTER — Ambulatory Visit (INDEPENDENT_AMBULATORY_CARE_PROVIDER_SITE_OTHER): Payer: Medicare HMO | Admitting: Cardiology

## 2019-04-23 VITALS — BP 130/82 | HR 91 | Ht 61.0 in | Wt 192.0 lb

## 2019-04-23 DIAGNOSIS — R079 Chest pain, unspecified: Secondary | ICD-10-CM
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
## 2019-04-23 NOTE — Patient Instructions (Signed)
Medication Instructions:  Your physician recommends that you continue on your current medications as directed. Please refer to the Current Medication list given to you today.  *If you need a refill on your cardiac medications before your next appointment, please call your pharmacy*  Lab Work: None   If you have labs (blood work) drawn today and your tests are completely normal, you will receive your results only by: . MyChart Message (if you have MyChart) OR . A paper copy in the mail If you have any lab test that is abnormal or we need to change your treatment, we will call you to review the results.  Testing/Procedures: None  Follow-Up: At [facility], you and your health needs are our priority.  As part of our continuing mission to provide you with exceptional heart care, we have created designated Provider Care Teams.  These Care Teams include your primary Cardiologist (physician) and Advanced Practice Providers (APPs -  Physician Assistants and Nurse Practitioners) who all work together to provide you with the care you need, when you need it.  Your next appointment:   3 month(s)  The format for your next appointment:   In Person  Provider:   Brian Munley, MD  

## 2019-04-24 ENCOUNTER — Encounter: Payer: Self-pay | Admitting: Cardiology

## 2019-04-24 ENCOUNTER — Ambulatory Visit: Payer: Medicare HMO | Admitting: Cardiology

## 2019-04-26 DIAGNOSIS — K921 Melena: Secondary | ICD-10-CM | POA: Diagnosis not present

## 2019-04-26 DIAGNOSIS — R1084 Generalized abdominal pain: Secondary | ICD-10-CM | POA: Diagnosis not present

## 2019-05-09 ENCOUNTER — Other Ambulatory Visit: Payer: Self-pay

## 2019-05-09 ENCOUNTER — Ambulatory Visit (INDEPENDENT_AMBULATORY_CARE_PROVIDER_SITE_OTHER): Payer: Medicare HMO

## 2019-05-09 DIAGNOSIS — F419 Anxiety disorder, unspecified: Secondary | ICD-10-CM | POA: Diagnosis not present

## 2019-05-09 DIAGNOSIS — R079 Chest pain, unspecified: Secondary | ICD-10-CM

## 2019-05-09 DIAGNOSIS — R0600 Dyspnea, unspecified: Secondary | ICD-10-CM

## 2019-05-09 DIAGNOSIS — I10 Essential (primary) hypertension: Secondary | ICD-10-CM | POA: Diagnosis not present

## 2019-05-09 DIAGNOSIS — Z2821 Immunization not carried out because of patient refusal: Secondary | ICD-10-CM | POA: Diagnosis not present

## 2019-05-09 DIAGNOSIS — R1031 Right lower quadrant pain: Secondary | ICD-10-CM | POA: Diagnosis not present

## 2019-05-09 DIAGNOSIS — J309 Allergic rhinitis, unspecified: Secondary | ICD-10-CM | POA: Diagnosis not present

## 2019-05-09 NOTE — Progress Notes (Signed)
Complete echocardiogram has been performed.  Jimmy Aylin Rhoads RDCS, RVT 

## 2019-05-15 DIAGNOSIS — R1031 Right lower quadrant pain: Secondary | ICD-10-CM | POA: Diagnosis not present

## 2019-05-15 DIAGNOSIS — K5730 Diverticulosis of large intestine without perforation or abscess without bleeding: Secondary | ICD-10-CM | POA: Diagnosis not present

## 2019-05-27 DIAGNOSIS — J309 Allergic rhinitis, unspecified: Secondary | ICD-10-CM | POA: Diagnosis not present

## 2019-05-27 DIAGNOSIS — F419 Anxiety disorder, unspecified: Secondary | ICD-10-CM | POA: Diagnosis not present

## 2019-05-27 DIAGNOSIS — J45909 Unspecified asthma, uncomplicated: Secondary | ICD-10-CM | POA: Diagnosis not present

## 2019-05-27 DIAGNOSIS — E559 Vitamin D deficiency, unspecified: Secondary | ICD-10-CM | POA: Diagnosis not present

## 2019-05-27 DIAGNOSIS — G4730 Sleep apnea, unspecified: Secondary | ICD-10-CM | POA: Diagnosis not present

## 2019-05-27 DIAGNOSIS — Z6834 Body mass index (BMI) 34.0-34.9, adult: Secondary | ICD-10-CM | POA: Diagnosis not present

## 2019-05-27 DIAGNOSIS — I10 Essential (primary) hypertension: Secondary | ICD-10-CM | POA: Diagnosis not present

## 2019-05-27 DIAGNOSIS — E669 Obesity, unspecified: Secondary | ICD-10-CM | POA: Diagnosis not present

## 2019-06-05 DIAGNOSIS — Z86010 Personal history of colonic polyps: Secondary | ICD-10-CM | POA: Diagnosis not present

## 2019-06-05 DIAGNOSIS — K228 Other specified diseases of esophagus: Secondary | ICD-10-CM | POA: Diagnosis not present

## 2019-06-05 DIAGNOSIS — K648 Other hemorrhoids: Secondary | ICD-10-CM | POA: Diagnosis not present

## 2019-06-05 DIAGNOSIS — Z1211 Encounter for screening for malignant neoplasm of colon: Secondary | ICD-10-CM | POA: Diagnosis not present

## 2019-06-05 DIAGNOSIS — K219 Gastro-esophageal reflux disease without esophagitis: Secondary | ICD-10-CM | POA: Diagnosis not present

## 2019-06-05 DIAGNOSIS — K625 Hemorrhage of anus and rectum: Secondary | ICD-10-CM | POA: Diagnosis not present

## 2019-06-05 DIAGNOSIS — R12 Heartburn: Secondary | ICD-10-CM | POA: Diagnosis not present

## 2019-06-05 DIAGNOSIS — R1084 Generalized abdominal pain: Secondary | ICD-10-CM | POA: Diagnosis not present

## 2019-06-05 DIAGNOSIS — K3189 Other diseases of stomach and duodenum: Secondary | ICD-10-CM | POA: Diagnosis not present

## 2019-06-05 DIAGNOSIS — K640 First degree hemorrhoids: Secondary | ICD-10-CM | POA: Diagnosis not present

## 2019-06-05 DIAGNOSIS — K5730 Diverticulosis of large intestine without perforation or abscess without bleeding: Secondary | ICD-10-CM | POA: Diagnosis not present

## 2019-06-05 DIAGNOSIS — K2100 Gastro-esophageal reflux disease with esophagitis, without bleeding: Secondary | ICD-10-CM | POA: Diagnosis not present

## 2019-07-01 DIAGNOSIS — R1084 Generalized abdominal pain: Secondary | ICD-10-CM | POA: Diagnosis not present

## 2019-07-01 DIAGNOSIS — R1031 Right lower quadrant pain: Secondary | ICD-10-CM | POA: Diagnosis not present

## 2019-07-01 DIAGNOSIS — K760 Fatty (change of) liver, not elsewhere classified: Secondary | ICD-10-CM | POA: Diagnosis not present

## 2019-07-01 DIAGNOSIS — I700 Atherosclerosis of aorta: Secondary | ICD-10-CM | POA: Diagnosis not present

## 2019-07-03 DIAGNOSIS — E669 Obesity, unspecified: Secondary | ICD-10-CM | POA: Diagnosis not present

## 2019-07-03 DIAGNOSIS — J45909 Unspecified asthma, uncomplicated: Secondary | ICD-10-CM | POA: Diagnosis not present

## 2019-07-03 DIAGNOSIS — F418 Other specified anxiety disorders: Secondary | ICD-10-CM | POA: Diagnosis not present

## 2019-07-03 DIAGNOSIS — Z6834 Body mass index (BMI) 34.0-34.9, adult: Secondary | ICD-10-CM | POA: Diagnosis not present

## 2019-07-03 DIAGNOSIS — F419 Anxiety disorder, unspecified: Secondary | ICD-10-CM | POA: Diagnosis not present

## 2019-07-03 DIAGNOSIS — R1031 Right lower quadrant pain: Secondary | ICD-10-CM | POA: Diagnosis not present

## 2019-07-03 DIAGNOSIS — Z5320 Procedure and treatment not carried out because of patient's decision for unspecified reasons: Secondary | ICD-10-CM | POA: Diagnosis not present

## 2019-07-08 ENCOUNTER — Telehealth: Payer: Self-pay | Admitting: Cardiology

## 2019-07-08 DIAGNOSIS — R05 Cough: Secondary | ICD-10-CM | POA: Diagnosis not present

## 2019-07-08 DIAGNOSIS — R002 Palpitations: Secondary | ICD-10-CM | POA: Diagnosis not present

## 2019-07-08 DIAGNOSIS — R079 Chest pain, unspecified: Secondary | ICD-10-CM | POA: Diagnosis not present

## 2019-07-08 NOTE — Telephone Encounter (Signed)
New Message   STAT if HR is under 50 or over 120 (normal HR is 60-100 beats per minute)  1) What is your heart rate? 94  2) Do you have a log of your heart rate readings (document readings)? 91 then 94  3) Do you have any other symptoms? Spitting up acid from acid reflux.

## 2019-07-08 NOTE — Telephone Encounter (Signed)
Spoke with patient. Patient going to the emergency room, would not specify why.

## 2019-07-10 DIAGNOSIS — Z09 Encounter for follow-up examination after completed treatment for conditions other than malignant neoplasm: Secondary | ICD-10-CM | POA: Diagnosis not present

## 2019-07-10 DIAGNOSIS — E669 Obesity, unspecified: Secondary | ICD-10-CM | POA: Diagnosis not present

## 2019-07-10 DIAGNOSIS — R002 Palpitations: Secondary | ICD-10-CM | POA: Diagnosis not present

## 2019-07-10 DIAGNOSIS — F418 Other specified anxiety disorders: Secondary | ICD-10-CM | POA: Diagnosis not present

## 2019-07-10 DIAGNOSIS — I10 Essential (primary) hypertension: Secondary | ICD-10-CM | POA: Diagnosis not present

## 2019-07-10 DIAGNOSIS — Z6834 Body mass index (BMI) 34.0-34.9, adult: Secondary | ICD-10-CM | POA: Diagnosis not present

## 2019-07-11 ENCOUNTER — Telehealth: Payer: Self-pay | Admitting: Cardiology

## 2019-07-11 ENCOUNTER — Telehealth: Payer: Medicare HMO | Admitting: Cardiology

## 2019-07-11 NOTE — Telephone Encounter (Signed)
New message  Patient has questions about taking her b/p medicine. Please call to discuss.

## 2019-07-11 NOTE — Telephone Encounter (Signed)
Spoke with patient. Clarified instructions for Carvedilol. F/U appt made 07/19/19 with Dr. Geraldo Pitter per pt request.

## 2019-07-19 ENCOUNTER — Encounter: Payer: Self-pay | Admitting: Cardiology

## 2019-07-19 ENCOUNTER — Other Ambulatory Visit: Payer: Self-pay

## 2019-07-19 ENCOUNTER — Telehealth: Payer: Self-pay | Admitting: Cardiology

## 2019-07-19 ENCOUNTER — Ambulatory Visit (INDEPENDENT_AMBULATORY_CARE_PROVIDER_SITE_OTHER): Payer: Medicare HMO | Admitting: Cardiology

## 2019-07-19 ENCOUNTER — Ambulatory Visit (INDEPENDENT_AMBULATORY_CARE_PROVIDER_SITE_OTHER): Payer: Medicare HMO

## 2019-07-19 VITALS — BP 130/78 | HR 92 | Temp 98.2°F | Resp 18 | Wt 185.5 lb

## 2019-07-19 DIAGNOSIS — R011 Cardiac murmur, unspecified: Secondary | ICD-10-CM

## 2019-07-19 DIAGNOSIS — R002 Palpitations: Secondary | ICD-10-CM

## 2019-07-19 DIAGNOSIS — I209 Angina pectoris, unspecified: Secondary | ICD-10-CM

## 2019-07-19 DIAGNOSIS — Z1329 Encounter for screening for other suspected endocrine disorder: Secondary | ICD-10-CM | POA: Diagnosis not present

## 2019-07-19 DIAGNOSIS — Z79899 Other long term (current) drug therapy: Secondary | ICD-10-CM

## 2019-07-19 HISTORY — DX: Angina pectoris, unspecified: I20.9

## 2019-07-19 HISTORY — DX: Cardiac murmur, unspecified: R01.1

## 2019-07-19 MED ORDER — METOPROLOL TARTRATE 100 MG PO TABS: ORAL_TABLET | ORAL | 0 refills | Status: DC

## 2019-07-19 NOTE — Telephone Encounter (Signed)
Pt seen this morning. Pt advised she can start the Bystolic tonight, in place of the Carvedilol she used to take (discontinued this morning), or she can wait and start it tomorrow morning if she prefers. Pt will start tomorrow morning. Pt appreciates the return call. Will forward to Dr. Julien Nordmann nurse for her Juluis Rainier and if they have different advisement for pt they can reach out to them.

## 2019-07-19 NOTE — Patient Instructions (Addendum)
Medication Instructions:  Your physician has recommended you make the following change in your medication:   Stop Coreg Start 0.5 (1/2) tablet of Bystolic 20 mg daily. Let us know how you are doing in a week.  [phone]  *If you need a refill on your cardiac medications before your next appointment, please call your pharmacy*   Lab Work: You had a TSH today. If you have labs (blood work) drawn today and your tests are completely normal, you will receive your results only by: Marland Kitchen MyChart Message (if you have MyChart) OR . A paper copy in the mail If you have any lab test that is abnormal or we need to change your treatment, we will call you to review the results.   Testing/Procedures: Your physician has requested that you have an echocardiogram. Echocardiography is a painless test that uses sound waves to create images of your heart. It provides your doctor with information about the size and shape of your heart and how well your heart's chambers and valves are working. This procedure takes approximately one hour. There are no restrictions for this procedure. Your physician has requested that you have cardiac CT. Cardiac computed tomography (CT) is a painless test that uses an x-ray machine to take clear, detailed pictures of your heart. For further information please visit [email or web address]. Please follow instruction sheet as given.   Your physician has recommended that you wear a Zio monitor. Zio monitors are medical devices that record the heart's electrical activity. Doctors most often [place] these monitors to diagnose arrhythmias. Arrhythmias are problems with the speed or rhythm of the heartbeat. The monitor is a small, portable device. You can wear one while you do your normal daily activities. This is usually used to diagnose what is causing palpitations/syncope (passing out). You need to wear the monitor for 2 weeks.   Follow-Up: At [facility], you and your health needs are our  priority.  As part of our continuing mission to provide you with exceptional heart care, we have created designated Provider Care Teams.  These Care Teams include your primary Cardiologist (physician) and Advanced Practice Providers (APPs -  Physician Assistants and Nurse Practitioners) who all work together to provide you with the care you need, when you need it.   Your next appointment:   1 month(s)  The format for your next appointment:   In Person  Provider:   Jyl Heinz, MD   Other Instructions  Your cardiac CT will be scheduled at one of the below locations:   [facility] [address] [place], [place] [address] [phone]   [facility], please arrive at the [facility] main entrance of [facility] 30 minutes prior to test start time. Proceed to the [facility] Radiology Department (first floor) to check-in and test prep.   Please follow these instructions carefully (unless otherwise directed):  On the Night Before the Test: . Be sure to Drink plenty of water. . Do not consume any caffeinated/decaffeinated beverages or chocolate 12 hours prior to your test. . Do not take any antihistamines 12 hours prior to your test.   On the Day of the Test: . Drink plenty of water. Do not drink any water within one hour of the test. . Do not eat any food 4 hours prior to the test. . You may take your regular medications prior to the test.  . Take metoprolol (Lopressor) two hours prior to test. . FEMALES- please wear underwire-free bra if  available         After the Test: . Drink plenty of water. . After receiving IV contrast, you may experience a mild flushed feeling. This is normal. . On occasion, you may experience a mild rash up to 24 hours after the test. This is not dangerous. If this occurs, you can take Benadryl 25 mg and increase your fluid intake. . If you experience trouble breathing, this can be serious. If it is severe call 911  IMMEDIATELY. If it is mild, please call our office. . If you take any of these medications: Glipizide/Metformin, Avandament, Glucavance, please do not take 48 hours after completing test unless otherwise instructed.   Once we have confirmed authorization from your insurance company, we will call you to set up a date and time for your test.   For non-scheduling related questions, please contact the cardiac imaging nurse navigator should you have any questions/concerns: Marchia Bond, RN Navigator Cardiac Imaging [facility] [phone] mobile   We recommend signing up for the patient portal called "MyChart".  Sign up information is provided on this After Visit Summary.  MyChart is used to connect with patients for Virtual Visits (Telemedicine).  Patients are able to view lab/test results, encounter notes, upcoming appointments, etc.  Non-urgent messages can be sent to your provider as well.   To learn more about what you can do with MyChart, go to [email or web address].
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
## 2019-07-19 NOTE — Addendum Note (Signed)
Addended by: Truddie Hidden on: 07/19/2019 01:53 PM   Modules accepted: Orders

## 2019-07-19 NOTE — Addendum Note (Signed)
Addended by: Truddie Hidden on: 07/19/2019 02:30 PM   Modules accepted: Orders

## 2019-07-19 NOTE — Telephone Encounter (Signed)
New Message    Pt is calling and says she was prescribed a new BP medication to take and is wondering if she needs to start it tomorrow because she already took her old BP medication this morning   Please advise

## 2019-07-20 LAB — TSH: TSH: 1.27 u[IU]/mL (ref 0.450–4.500)

## 2019-07-22 NOTE — Telephone Encounter (Signed)
How do you advise?  Called patient- she states that she started bystolic on Friday and since then she states it makes her heart sore, she states it isn't painful- but she does not want to continue the medication- she took it at 9 this morning, and states it has improved, but she does not know what to do about tomorrow. I advised patient the medication could be helping her, but she wanted me to ask MD what she should do.  Advised patient I would route to MD to advise.   Thank you!

## 2019-07-22 NOTE — Telephone Encounter (Signed)
Continue medicines for a few days at least for a week and see how things happen if not we will always prescribed a different medicine.

## 2019-07-22 NOTE — Telephone Encounter (Signed)
Pt states that she cannot take this medication anymore and states that the only thing that has worked in the past is Lisinopril.  What do you advise now? Pt states that the pain I her heart , lt arm and had nausea. Pt states that she is having facial swelling. Pt states that it "pulls the arteries in her neck."

## 2019-07-22 NOTE — Telephone Encounter (Signed)
Called patient- she states that she started bystolic on Friday and since then she states it makes her heart sore, she states it isn't painful- but she does not want to continue the medication- she took it at 9 this morning, and states it has improved, but she does not know what to do about tomorrow. I advised patient the medication could be helping her, but she wanted me to ask MD what she should do.  Advised patient I would route to MD to advise.   Thank you!

## 2019-07-22 NOTE — Telephone Encounter (Signed)
Patient states the Bystolic medication has caused her heart to become sore. Patient states she has not experienced chest pain or any other symptoms. Please advise.

## 2019-07-23 MED ORDER — LISINOPRIL 10 MG PO TABS: 10.0000 mg | ORAL_TABLET | Freq: Every day | ORAL | 12 refills | Status: DC

## 2019-07-23 NOTE — Telephone Encounter (Signed)
Sent for lisinopril 10 mg daily.  Also blood pressure check twice a day and Chem-7 in a week.

## 2019-07-23 NOTE — Addendum Note (Signed)
Addended by: Truddie Hidden on: 07/23/2019 08:41 AM   Modules accepted: Orders

## 2019-07-24 ENCOUNTER — Telehealth: Payer: Self-pay | Admitting: Cardiology

## 2019-07-24 NOTE — Telephone Encounter (Signed)
Returned call to patient.She stated she could not take Bystolic.Stated caused chest pain.Stated she is having trouble with Lisinopril 10 mg.Stated 10 mg is too strong makes her dizzy,nausea.She wants to take 5 mg daily.Her B/P this morning 146/82 pulse 73.Advised I will send message to Dr.Revankar for advice.

## 2019-07-24 NOTE — Telephone Encounter (Signed)
That should be fine she can take 5 mg and keep a track of her blood pressure.

## 2019-07-24 NOTE — Telephone Encounter (Signed)
Returned call to patient Dr.Revankar advised ok to take Lisinopril 5 mg daily.Advised to monitor B/P and call back if elevated.

## 2019-07-24 NOTE — Telephone Encounter (Signed)
Pt c/o medication issue:  1. Name of Medication: Lisinopril  2. How are you currently taking this medication (dosage and times per day)? 1 tablet daily  3. Are you having a reaction (difficulty breathing--STAT)? States medication is too strong.   4. What is your medication issue? Patient states that the medication is too strong. She wants to know if she can break it in half so she only has to take 5mg 

## 2019-07-30 ENCOUNTER — Ambulatory Visit: Payer: Medicare HMO | Admitting: Cardiology

## 2019-07-30 DIAGNOSIS — I209 Angina pectoris, unspecified: Secondary | ICD-10-CM | POA: Diagnosis not present

## 2019-07-30 DIAGNOSIS — R002 Palpitations: Secondary | ICD-10-CM | POA: Diagnosis not present

## 2019-07-31 ENCOUNTER — Encounter: Payer: Self-pay | Admitting: *Deleted

## 2019-07-31 LAB — BASIC METABOLIC PANEL
BUN/Creatinine Ratio: 15 (ref 12–28)
BUN: 11 mg/dL (ref 8–27)
CO2: 22 mmol/L (ref 20–29)
Calcium: 9.1 mg/dL (ref 8.7–10.3)
Chloride: 105 mmol/L (ref 96–106)
Creatinine, Ser: 0.72 mg/dL (ref 0.57–1.00)
GFR calc Af Amer: 100 mL/min/{1.73_m2} (ref >59)
GFR calc non Af Amer: 87 mL/min/{1.73_m2} (ref >59)
Glucose: 108 mg/dL — ABNORMAL HIGH (ref 65–99)
Potassium: 4.1 mmol/L (ref 3.5–5.2)
Sodium: 141 mmol/L (ref 134–144)

## 2019-08-01 DIAGNOSIS — Z09 Encounter for follow-up examination after completed treatment for conditions other than malignant neoplasm: Secondary | ICD-10-CM | POA: Diagnosis not present

## 2019-08-01 DIAGNOSIS — J45909 Unspecified asthma, uncomplicated: Secondary | ICD-10-CM | POA: Diagnosis not present

## 2019-08-01 DIAGNOSIS — N952 Postmenopausal atrophic vaginitis: Secondary | ICD-10-CM | POA: Diagnosis not present

## 2019-08-01 DIAGNOSIS — R002 Palpitations: Secondary | ICD-10-CM | POA: Diagnosis not present

## 2019-08-01 DIAGNOSIS — F418 Other specified anxiety disorders: Secondary | ICD-10-CM | POA: Diagnosis not present

## 2019-08-01 DIAGNOSIS — E669 Obesity, unspecified: Secondary | ICD-10-CM | POA: Diagnosis not present

## 2019-08-01 DIAGNOSIS — Z6834 Body mass index (BMI) 34.0-34.9, adult: Secondary | ICD-10-CM | POA: Diagnosis not present

## 2019-08-01 DIAGNOSIS — I10 Essential (primary) hypertension: Secondary | ICD-10-CM | POA: Diagnosis not present

## 2019-08-01 DIAGNOSIS — N760 Acute vaginitis: Secondary | ICD-10-CM | POA: Diagnosis not present

## 2019-08-05 ENCOUNTER — Telehealth: Payer: Self-pay

## 2019-08-05 NOTE — Telephone Encounter (Signed)
Called and spoke with the pt regarding contrast and she states that the reaction was "my brain hurt so bad and I had nausea." Pt has had contast several times in the past and never had a reaction.

## 2019-08-07 DIAGNOSIS — R002 Palpitations: Secondary | ICD-10-CM | POA: Diagnosis not present

## 2019-08-16 ENCOUNTER — Ambulatory Visit: Payer: Medicare HMO | Admitting: Cardiology

## 2019-08-19 ENCOUNTER — Telehealth: Payer: Self-pay | Admitting: Cardiology

## 2019-08-19 NOTE — Telephone Encounter (Signed)
Called back and appointment scheduled.  Pt has an appointment 09/11/19 to discuss results of ZIO and cardiac CT.

## 2019-08-19 NOTE — Telephone Encounter (Signed)
Patient called stating she received a phone call from our office. She wants to know what call was about.  She does have a upcoming CT scan scheduled for 4/7.

## 2019-08-26 ENCOUNTER — Telehealth (HOSPITAL_COMMUNITY): Payer: Self-pay | Admitting: Emergency Medicine

## 2019-08-26 NOTE — Telephone Encounter (Signed)
Reaching out to patient to offer assistance regarding upcoming cardiac imaging study; pt verbalizes understanding of appt date/time, parking situation and where to check in, pre-test NPO status and medications ordered, and verified current allergies; name and call back number provided for further questions should they arise Marchia Bond RN Navigator Cardiac Imaging [facility] [phone] office [phone] cell  Pt questioning when to take PO metoprolol for test. Instructed her to take BP/pulse 2 hr prior to appt. If HR > 55 , she will take PO metoprolol at that time.   Pt also phobic of needles, has a rx for xanax, encouraged her to take a dose 30 mins prior to appt. States she does not drive anymore, someone will be bringing her to appt.   Pt appreciated the call. Clarise Cruz

## 2019-08-28 ENCOUNTER — Ambulatory Visit (HOSPITAL_COMMUNITY)
Admission: RE | Admit: 2019-08-28 | Discharge: 2019-08-28 | Disposition: A | Discharge status: Home / Self Care | Payer: Medicare HMO | Source: Ambulatory Visit | Attending: Cardiology | Admitting: Cardiology

## 2019-08-28 ENCOUNTER — Encounter (HOSPITAL_COMMUNITY): Payer: Self-pay

## 2019-08-28 ENCOUNTER — Other Ambulatory Visit: Payer: Self-pay

## 2019-08-28 DIAGNOSIS — R002 Palpitations: Secondary | ICD-10-CM | POA: Diagnosis not present

## 2019-08-28 DIAGNOSIS — I209 Angina pectoris, unspecified: Secondary | ICD-10-CM | POA: Insufficient documentation

## 2019-08-28 MED ORDER — IOHEXOL 350 MG/ML SOLN
80.0000 mL | Freq: Once | INTRAVENOUS | Status: AC | PRN
  Administered 2019-08-28: 80 mL via INTRAVENOUS

## 2019-08-28 MED ORDER — NITROGLYCERIN 0.4 MG SL SUBL
SUBLINGUAL_TABLET | SUBLINGUAL | Status: AC
  Filled 2019-08-28: qty 2

## 2019-08-28 MED ORDER — NITROGLYCERIN 0.4 MG SL SUBL
0.8000 mg | SUBLINGUAL_TABLET | Freq: Once | SUBLINGUAL | Status: AC
  Administered 2019-08-28: 0.8 mg via SUBLINGUAL

## 2019-08-28 MED ORDER — METOPROLOL TARTRATE 5 MG/5ML IV SOLN
INTRAVENOUS | Status: AC
  Filled 2019-08-28: qty 5

## 2019-08-29 ENCOUNTER — Ambulatory Visit (HOSPITAL_COMMUNITY)
Admission: RE | Admit: 2019-08-29 | Discharge: 2019-08-29 | Disposition: A | Discharge status: Home / Self Care | Payer: Medicare HMO | Source: Ambulatory Visit | Attending: Cardiology | Admitting: Cardiology

## 2019-08-29 DIAGNOSIS — R002 Palpitations: Secondary | ICD-10-CM | POA: Diagnosis not present

## 2019-08-29 DIAGNOSIS — I209 Angina pectoris, unspecified: Secondary | ICD-10-CM

## 2019-09-01 DIAGNOSIS — I209 Angina pectoris, unspecified: Secondary | ICD-10-CM | POA: Diagnosis not present

## 2019-09-02 DIAGNOSIS — E669 Obesity, unspecified: Secondary | ICD-10-CM | POA: Diagnosis not present

## 2019-09-02 DIAGNOSIS — F419 Anxiety disorder, unspecified: Secondary | ICD-10-CM | POA: Diagnosis not present

## 2019-09-02 DIAGNOSIS — J45909 Unspecified asthma, uncomplicated: Secondary | ICD-10-CM | POA: Diagnosis not present

## 2019-09-02 DIAGNOSIS — J309 Allergic rhinitis, unspecified: Secondary | ICD-10-CM | POA: Diagnosis not present

## 2019-09-02 DIAGNOSIS — I10 Essential (primary) hypertension: Secondary | ICD-10-CM | POA: Diagnosis not present

## 2019-09-02 DIAGNOSIS — Z2821 Immunization not carried out because of patient refusal: Secondary | ICD-10-CM | POA: Diagnosis not present

## 2019-09-02 DIAGNOSIS — E559 Vitamin D deficiency, unspecified: Secondary | ICD-10-CM | POA: Diagnosis not present

## 2019-09-02 DIAGNOSIS — Z6834 Body mass index (BMI) 34.0-34.9, adult: Secondary | ICD-10-CM | POA: Diagnosis not present

## 2019-09-10 ENCOUNTER — Other Ambulatory Visit: Payer: Self-pay

## 2019-09-11 ENCOUNTER — Encounter: Payer: Self-pay | Admitting: Cardiology

## 2019-09-11 ENCOUNTER — Other Ambulatory Visit: Payer: Self-pay | Admitting: Cardiology

## 2019-09-11 ENCOUNTER — Ambulatory Visit (INDEPENDENT_AMBULATORY_CARE_PROVIDER_SITE_OTHER): Payer: Medicare HMO | Admitting: Cardiology

## 2019-09-11 ENCOUNTER — Other Ambulatory Visit: Payer: Self-pay

## 2019-09-11 VITALS — BP 132/74 | HR 88 | Temp 98.5°F | Ht 61.0 in | Wt 184.6 lb

## 2019-09-11 DIAGNOSIS — R002 Palpitations: Secondary | ICD-10-CM | POA: Diagnosis not present

## 2019-09-11 MED ORDER — METOPROLOL SUCCINATE 25 MG PO CS24: 25.0000 mg | EXTENDED_RELEASE_CAPSULE | Freq: Every day | ORAL | 3 refills | Status: DC
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
## 2019-09-11 NOTE — Patient Instructions (Signed)
Medication Instructions:  Your physician has recommended you make the following change in your medication:  START: Metoprolol Succinate 25 mg take one tablet by mouth daily.  *If you need a refill on your cardiac medications before your next appointment, please call your pharmacy*   Lab Work: None If you have labs (blood work) drawn today and your tests are completely normal, you will receive your results only by: Marland Kitchen MyChart Message (if you have MyChart) OR . A paper copy in the mail If you have any lab test that is abnormal or we need to change your treatment, we will call you to review the results.   Testing/Procedures: None   Follow-Up: At [facility], you and your health needs are our priority.  As part of our continuing mission to provide you with exceptional heart care, we have created designated Provider Care Teams.  These Care Teams include your primary Cardiologist (physician) and Advanced Practice Providers (APPs -  Physician Assistants and Nurse Practitioners) who all work together to provide you with the care you need, when you need it.  We recommend signing up for the patient portal called "MyChart".  Sign up information is provided on this After Visit Summary.  MyChart is used to connect with patients for Virtual Visits (Telemedicine).  Patients are able to view lab/test results, encounter notes, upcoming appointments, etc.  Non-urgent messages can be sent to your provider as well.   To learn more about what you can do with MyChart, go to [email or web address].    Your next appointment:   6 month(s)  The format for your next appointment:   In Person  Provider:   Jyl Heinz, MD   Other Instructions

## 2019-09-13 ENCOUNTER — Telehealth: Payer: Self-pay | Admitting: Cardiology

## 2019-09-13 DIAGNOSIS — T447X5A Adverse effect of beta-adrenoreceptor antagonists, initial encounter: Secondary | ICD-10-CM | POA: Diagnosis not present

## 2019-09-13 DIAGNOSIS — R002 Palpitations: Secondary | ICD-10-CM | POA: Diagnosis not present

## 2019-09-13 DIAGNOSIS — R079 Chest pain, unspecified: Secondary | ICD-10-CM | POA: Diagnosis not present

## 2019-09-13 DIAGNOSIS — T7840XA Allergy, unspecified, initial encounter: Secondary | ICD-10-CM | POA: Diagnosis not present

## 2019-09-13 NOTE — Telephone Encounter (Signed)
Patient calling back stating she forgot to add that her face looks puffy. She states she also had a headache when she first started it. She states she was scared to go to sleep last night, because of her symptoms. Patient states she was on carvedilol 25 mg which was cut down to 6.25 mg and she could not take either of them, because she had the same symptoms.

## 2019-09-13 NOTE — Telephone Encounter (Signed)
Pt c/o medication issue:  1. Name of Medication: Metoprolol Succinate 25 MG CS24  2. How are you currently taking this medication (dosage and times per day)? As directed. Takes it at 9:30am  3. Are you having a reaction (difficulty breathing--STAT)? Yes. Denies difficulty breathing.   4. What is your medication issue? Medication is causing her to feel nauseous, heart feels sore and tongue is swelling. She wants to know if she can cut the tablet in half.

## 2019-09-13 NOTE — Telephone Encounter (Signed)
Spoke to the patient just now and advised her that if she is having this swelling of her tongue and face that she needs to proceed to the emergency room as these are signs of an allergic reaction. She verbalizes understanding and states that she is going to do this now.    Encouraged patient to call back with any questions or concerns.

## 2019-09-16 ENCOUNTER — Telehealth: Payer: Self-pay | Admitting: Cardiology

## 2019-09-16 NOTE — Telephone Encounter (Signed)
Patient called back because she has not heard anything from the nurse yet. She does not want to go back to the ER but needs to know what to do to get her palpitations under control. She has an appt scheduled for 05-04 but would like to be seen only by Dr. Geraldo Pitter asap

## 2019-09-16 NOTE — Telephone Encounter (Signed)
Pt c/o medication issue:  1. Name of Medication: Metoprolol  25 mg  2. How are you currently taking this medication (dosage and times per day)? 1 time a day  3. Are you having a reaction (difficulty breathing--STAT)? Yes- pt said she had to go to [facility] on 09-13-19  4. What is your medication issue? tongue swelled, chest hurting, palpitations and short of breath- need to be seen

## 2019-09-17 NOTE — Telephone Encounter (Signed)
Called and spoke with pt regarding symptoms. Pt states that she was seen in the ED last week and now she is having ankle pain up into her hip. I told her to rest her leg that is causing her pain and to see her PCP. Pt was offered an appointment with Dr. Harriet Masson this week and she declined stating she will wait and be seen next week. Pt states that she will go back to the ED if needed with her chest pain. PT encouraged to call us for any concerns or questions.

## 2019-09-24 ENCOUNTER — Ambulatory Visit (INDEPENDENT_AMBULATORY_CARE_PROVIDER_SITE_OTHER): Payer: Medicare HMO | Admitting: Cardiology

## 2019-09-24 ENCOUNTER — Other Ambulatory Visit: Payer: Self-pay

## 2019-09-24 ENCOUNTER — Encounter: Payer: Self-pay | Admitting: Cardiology

## 2019-09-24 VITALS — BP 126/72 | HR 72 | Ht 61.0 in | Wt 185.0 lb

## 2019-09-24 DIAGNOSIS — R002 Palpitations: Secondary | ICD-10-CM

## 2019-09-24 NOTE — Patient Instructions (Signed)
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
## 2019-10-03 DIAGNOSIS — F419 Anxiety disorder, unspecified: Secondary | ICD-10-CM | POA: Diagnosis not present

## 2019-10-03 DIAGNOSIS — R002 Palpitations: Secondary | ICD-10-CM | POA: Diagnosis not present

## 2019-10-03 DIAGNOSIS — R300 Dysuria: Secondary | ICD-10-CM | POA: Diagnosis not present

## 2019-10-03 DIAGNOSIS — E669 Obesity, unspecified: Secondary | ICD-10-CM | POA: Diagnosis not present

## 2019-10-03 DIAGNOSIS — E785 Hyperlipidemia, unspecified: Secondary | ICD-10-CM | POA: Diagnosis not present

## 2019-10-03 DIAGNOSIS — Z79899 Other long term (current) drug therapy: Secondary | ICD-10-CM | POA: Diagnosis not present

## 2019-10-03 DIAGNOSIS — Z6834 Body mass index (BMI) 34.0-34.9, adult: Secondary | ICD-10-CM | POA: Diagnosis not present

## 2019-10-03 DIAGNOSIS — E559 Vitamin D deficiency, unspecified: Secondary | ICD-10-CM | POA: Diagnosis not present

## 2019-10-16 DIAGNOSIS — Z6834 Body mass index (BMI) 34.0-34.9, adult: Secondary | ICD-10-CM | POA: Diagnosis not present

## 2019-10-16 DIAGNOSIS — E669 Obesity, unspecified: Secondary | ICD-10-CM | POA: Diagnosis not present

## 2019-10-16 DIAGNOSIS — R21 Rash and other nonspecific skin eruption: Secondary | ICD-10-CM | POA: Diagnosis not present

## 2019-10-23 DIAGNOSIS — R0602 Shortness of breath: Secondary | ICD-10-CM | POA: Diagnosis not present

## 2019-10-23 DIAGNOSIS — Z87891 Personal history of nicotine dependence: Secondary | ICD-10-CM | POA: Diagnosis not present

## 2019-10-23 DIAGNOSIS — Z79899 Other long term (current) drug therapy: Secondary | ICD-10-CM | POA: Diagnosis not present

## 2019-10-23 DIAGNOSIS — F419 Anxiety disorder, unspecified: Secondary | ICD-10-CM | POA: Diagnosis not present

## 2019-10-23 DIAGNOSIS — I959 Hypotension, unspecified: Secondary | ICD-10-CM | POA: Diagnosis not present

## 2019-10-23 DIAGNOSIS — I10 Essential (primary) hypertension: Secondary | ICD-10-CM | POA: Diagnosis not present

## 2019-10-23 DIAGNOSIS — F439 Reaction to severe stress, unspecified: Secondary | ICD-10-CM | POA: Diagnosis not present

## 2019-10-23 DIAGNOSIS — F329 Major depressive disorder, single episode, unspecified: Secondary | ICD-10-CM | POA: Diagnosis not present

## 2019-10-23 DIAGNOSIS — R55 Syncope and collapse: Secondary | ICD-10-CM | POA: Diagnosis not present

## 2019-10-23 DIAGNOSIS — R4020 Unspecified coma: Secondary | ICD-10-CM | POA: Diagnosis not present

## 2019-10-23 DIAGNOSIS — G8929 Other chronic pain: Secondary | ICD-10-CM | POA: Diagnosis not present

## 2019-10-23 DIAGNOSIS — F430 Acute stress reaction: Secondary | ICD-10-CM | POA: Diagnosis not present

## 2019-10-23 DIAGNOSIS — Z8673 Personal history of transient ischemic attack (TIA), and cerebral infarction without residual deficits: Secondary | ICD-10-CM | POA: Diagnosis not present

## 2019-10-25 DIAGNOSIS — Z09 Encounter for follow-up examination after completed treatment for conditions other than malignant neoplasm: Secondary | ICD-10-CM | POA: Diagnosis not present

## 2019-10-25 DIAGNOSIS — Z6834 Body mass index (BMI) 34.0-34.9, adult: Secondary | ICD-10-CM | POA: Diagnosis not present

## 2019-10-25 DIAGNOSIS — E669 Obesity, unspecified: Secondary | ICD-10-CM | POA: Diagnosis not present

## 2019-10-25 DIAGNOSIS — F4321 Adjustment disorder with depressed mood: Secondary | ICD-10-CM | POA: Diagnosis not present

## 2019-10-25 DIAGNOSIS — H01116 Allergic dermatitis of left eye, unspecified eyelid: Secondary | ICD-10-CM | POA: Diagnosis not present

## 2019-10-25 DIAGNOSIS — R21 Rash and other nonspecific skin eruption: Secondary | ICD-10-CM | POA: Diagnosis not present

## 2019-11-04 DIAGNOSIS — F418 Other specified anxiety disorders: Secondary | ICD-10-CM | POA: Diagnosis not present

## 2019-11-04 DIAGNOSIS — M7989 Other specified soft tissue disorders: Secondary | ICD-10-CM | POA: Diagnosis not present

## 2019-11-04 DIAGNOSIS — I10 Essential (primary) hypertension: Secondary | ICD-10-CM | POA: Diagnosis not present

## 2019-11-28 DIAGNOSIS — Z6834 Body mass index (BMI) 34.0-34.9, adult: Secondary | ICD-10-CM | POA: Diagnosis not present

## 2019-11-28 DIAGNOSIS — E669 Obesity, unspecified: Secondary | ICD-10-CM | POA: Diagnosis not present

## 2019-11-28 DIAGNOSIS — J4520 Mild intermittent asthma, uncomplicated: Secondary | ICD-10-CM | POA: Diagnosis not present

## 2019-11-28 DIAGNOSIS — J019 Acute sinusitis, unspecified: Secondary | ICD-10-CM | POA: Diagnosis not present

## 2019-12-11 DIAGNOSIS — E785 Hyperlipidemia, unspecified: Secondary | ICD-10-CM | POA: Diagnosis not present

## 2019-12-11 DIAGNOSIS — Z0000 Encounter for general adult medical examination without abnormal findings: Secondary | ICD-10-CM | POA: Diagnosis not present

## 2019-12-11 DIAGNOSIS — Z1331 Encounter for screening for depression: Secondary | ICD-10-CM | POA: Diagnosis not present

## 2019-12-11 DIAGNOSIS — Z6834 Body mass index (BMI) 34.0-34.9, adult: Secondary | ICD-10-CM | POA: Diagnosis not present

## 2019-12-12 DIAGNOSIS — E785 Hyperlipidemia, unspecified: Secondary | ICD-10-CM | POA: Diagnosis not present

## 2019-12-12 DIAGNOSIS — R42 Dizziness and giddiness: Secondary | ICD-10-CM | POA: Diagnosis not present

## 2020-01-07 DIAGNOSIS — J309 Allergic rhinitis, unspecified: Secondary | ICD-10-CM | POA: Diagnosis not present

## 2020-01-07 DIAGNOSIS — J45909 Unspecified asthma, uncomplicated: Secondary | ICD-10-CM | POA: Diagnosis not present

## 2020-01-07 DIAGNOSIS — E559 Vitamin D deficiency, unspecified: Secondary | ICD-10-CM | POA: Diagnosis not present

## 2020-01-07 DIAGNOSIS — I10 Essential (primary) hypertension: Secondary | ICD-10-CM | POA: Diagnosis not present

## 2020-01-07 DIAGNOSIS — F418 Other specified anxiety disorders: Secondary | ICD-10-CM | POA: Diagnosis not present

## 2020-01-28 ENCOUNTER — Telehealth: Payer: Self-pay | Admitting: Cardiology

## 2020-01-28 NOTE — Telephone Encounter (Signed)
Spoke to patient just now and I asked her what she meant by "making my heart hurt". She states that just a few minutes after taking this pill she will have a sharp pain in her chest that only lasts a couple seconds. She states that it is only after taking this pill that she feels this. She has tried eating beforehand and after but this does not help. She states that she believes she needs to have an EKG done and she is scheduled to see Dr. Geraldo Pitter on Friday. I told her to keep this appointment and that I would reach out to Dr. Geraldo Pitter to see if she should discontinue her lisinopril until then or not.

## 2020-01-28 NOTE — Telephone Encounter (Signed)
Pt c/o medication issue:  1. Name of Medication: lisinopril (ZESTRIL) 10 MG tablet  2. How are you currently taking this medication (dosage and times per day)? As directed  3. Are you having a reaction (difficulty breathing--STAT)? possibly  4. What is your medication issue? Patient states that this past week this medication has been hurting her heart for 3 out of the 7 days she has taken it. She states her son died a little over 3 months ago so she is not sure if it has something to do with that. She states she is scared to take it but she wants to know if she should take it today.

## 2020-01-28 NOTE — Telephone Encounter (Signed)
Patient called back tonight for further instructions. She says she was told to take lisinopril if SBP was > 130 and did so at 9pm tonight when it reached 150. Tolerated this okay. This soreness in her chest after taking lisinopril has been going on for 3 days and seems to only occur after taking this medication per her report.  No symptoms concerning for angioedema, no dyspnea. Notably does report history of severe GERD. Advised her to continue this plan as previously instructed with dosing based on BP and follow-up this Friday as scheduled.

## 2020-01-31 ENCOUNTER — Encounter: Payer: Self-pay | Admitting: Cardiology

## 2020-01-31 ENCOUNTER — Ambulatory Visit (INDEPENDENT_AMBULATORY_CARE_PROVIDER_SITE_OTHER): Payer: Medicare HMO | Admitting: Cardiology

## 2020-01-31 ENCOUNTER — Other Ambulatory Visit: Payer: Self-pay

## 2020-01-31 DIAGNOSIS — I2510 Atherosclerotic heart disease of native coronary artery without angina pectoris: Secondary | ICD-10-CM | POA: Insufficient documentation

## 2020-01-31 DIAGNOSIS — E663 Overweight: Secondary | ICD-10-CM

## 2020-01-31 DIAGNOSIS — E782 Mixed hyperlipidemia: Secondary | ICD-10-CM | POA: Insufficient documentation

## 2020-01-31 DIAGNOSIS — R55 Syncope and collapse: Secondary | ICD-10-CM

## 2020-01-31 HISTORY — DX: Atherosclerotic heart disease of native coronary artery without angina pectoris: I25.10

## 2020-01-31 HISTORY — DX: Syncope and collapse: R55

## 2020-01-31 HISTORY — DX: Mixed hyperlipidemia: E78.2

## 2020-01-31 HISTORY — DX: Overweight: E66.3
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
## 2020-01-31 NOTE — Patient Instructions (Signed)
Medication Instructions:  Your physician recommends that you continue on your current medications as directed. Please refer to the Current Medication list given to you today.  *If you need a refill on your cardiac medications before your next appointment, please call your pharmacy*   Lab Work: None ordered   If you have labs (blood work) drawn today and your tests are completely normal, you will receive your results only by: . MyChart Message (if you have MyChart) OR . A paper copy in the mail If you have any lab test that is abnormal or we need to change your treatment, we will call you to review the results.   Testing/Procedures: None ordered    Follow-Up: At [facility], you and your health needs are our priority.  As part of our continuing mission to provide you with exceptional heart care, we have created designated Provider Care Teams.  These Care Teams include your primary Cardiologist (physician) and Advanced Practice Providers (APPs -  Physician Assistants and Nurse Practitioners) who all work together to provide you with the care you need, when you need it.  We recommend signing up for the patient portal called "MyChart".  Sign up information is provided on this After Visit Summary.  MyChart is used to connect with patients for Virtual Visits (Telemedicine).  Patients are able to view lab/test results, encounter notes, upcoming appointments, etc.  Non-urgent messages can be sent to your provider as well.   To learn more about what you can do with MyChart, go to [email or web address].    Your next appointment:   6 month(s)  The format for your next appointment:   In Person  Provider:   Rajan Revankar, MD   Other Instructions None   

## 2020-02-04 DIAGNOSIS — I10 Essential (primary) hypertension: Secondary | ICD-10-CM | POA: Diagnosis not present

## 2020-02-04 DIAGNOSIS — F418 Other specified anxiety disorders: Secondary | ICD-10-CM | POA: Diagnosis not present

## 2020-02-04 DIAGNOSIS — B370 Candidal stomatitis: Secondary | ICD-10-CM | POA: Diagnosis not present

## 2020-02-04 DIAGNOSIS — E669 Obesity, unspecified: Secondary | ICD-10-CM | POA: Diagnosis not present

## 2020-02-04 DIAGNOSIS — Z6835 Body mass index (BMI) 35.0-35.9, adult: Secondary | ICD-10-CM | POA: Diagnosis not present

## 2020-02-04 DIAGNOSIS — M159 Polyosteoarthritis, unspecified: Secondary | ICD-10-CM | POA: Diagnosis not present

## 2020-02-04 DIAGNOSIS — E559 Vitamin D deficiency, unspecified: Secondary | ICD-10-CM | POA: Diagnosis not present

## 2020-03-12 DIAGNOSIS — K7581 Nonalcoholic steatohepatitis (NASH): Secondary | ICD-10-CM | POA: Diagnosis not present

## 2020-03-12 DIAGNOSIS — R600 Localized edema: Secondary | ICD-10-CM | POA: Diagnosis not present

## 2020-03-12 DIAGNOSIS — K219 Gastro-esophageal reflux disease without esophagitis: Secondary | ICD-10-CM | POA: Diagnosis not present

## 2020-03-12 DIAGNOSIS — F32A Depression, unspecified: Secondary | ICD-10-CM | POA: Diagnosis not present

## 2020-03-12 DIAGNOSIS — F419 Anxiety disorder, unspecified: Secondary | ICD-10-CM | POA: Diagnosis not present

## 2020-03-12 DIAGNOSIS — E669 Obesity, unspecified: Secondary | ICD-10-CM | POA: Diagnosis not present

## 2020-03-12 DIAGNOSIS — Z6835 Body mass index (BMI) 35.0-35.9, adult: Secondary | ICD-10-CM | POA: Diagnosis not present

## 2020-03-16 ENCOUNTER — Other Ambulatory Visit: Payer: Self-pay

## 2020-03-17 ENCOUNTER — Other Ambulatory Visit: Payer: Self-pay

## 2020-03-17 ENCOUNTER — Encounter: Payer: Self-pay | Admitting: Cardiology

## 2020-03-17 ENCOUNTER — Ambulatory Visit (INDEPENDENT_AMBULATORY_CARE_PROVIDER_SITE_OTHER): Payer: Medicare HMO | Admitting: Cardiology

## 2020-03-17 VITALS — BP 128/66 | HR 92 | Ht 61.0 in | Wt 185.4 lb

## 2020-03-17 DIAGNOSIS — E663 Overweight: Secondary | ICD-10-CM

## 2020-03-17 DIAGNOSIS — R072 Precordial pain: Secondary | ICD-10-CM | POA: Diagnosis not present

## 2020-03-17 DIAGNOSIS — I2510 Atherosclerotic heart disease of native coronary artery without angina pectoris: Secondary | ICD-10-CM | POA: Diagnosis not present

## 2020-03-17 DIAGNOSIS — E782 Mixed hyperlipidemia: Secondary | ICD-10-CM | POA: Diagnosis not present

## 2020-03-17 MED ORDER — NITROGLYCERIN 0.4 MG SL SUBL: 0.4000 mg | SUBLINGUAL_TABLET | SUBLINGUAL | 6 refills | Status: DC | PRN

## 2020-03-17 NOTE — Patient Instructions (Addendum)
Medication Instructions:  Your physician has recommended you make the following change in your medication:   Take Nitroglycerin as needed for chest pain. See the attachment on Nitroglycerin.  *If you need a refill on your cardiac medications before your next appointment, please call your pharmacy*   Lab Work: None ordered If you have labs (blood work) drawn today and your tests are completely normal, you will receive your results only by: Marland Kitchen MyChart Message (if you have MyChart) OR . A paper copy in the mail If you have any lab test that is abnormal or we need to change your treatment, we will call you to review the results.   Testing/Procedures: Your physician has requested that you have a lexiscan myoview. For further information please visit [email or web address]. Please follow instruction sheet, as given.  The test will take approximately 3 to 4 hours to complete; you may bring reading material.  If someone comes with you to your appointment, they will need to remain in the main lobby due to limited space in the testing area.  How to prepare for your Myocardial Perfusion Test: . Do not eat or drink 3 hours prior to your test, except you may have water. . Do not consume products containing caffeine (regular or decaffeinated) 12 hours prior to your test. (ex: coffee, chocolate, sodas, tea). . Do bring a list of your current medications with you.  If not listed below, you may take your medications as normal. . Do wear comfortable clothes (no dresses or overalls) and walking shoes, tennis shoes preferred (No heels or open toe shoes are allowed). . Do NOT wear cologne, perfume, aftershave, or lotions (deodorant is allowed). . If these instructions are not followed, your test will have to be rescheduled.    Follow-Up: At [facility], you and your health needs are our priority.  As part of our continuing mission to provide you with exceptional heart care, we have created designated  Provider Care Teams.  These Care Teams include your primary Cardiologist (physician) and Advanced Practice Providers (APPs -  Physician Assistants and Nurse Practitioners) who all work together to provide you with the care you need, when you need it.  We recommend signing up for the patient portal called "MyChart".  Sign up information is provided on this After Visit Summary.  MyChart is used to connect with patients for Virtual Visits (Telemedicine).  Patients are able to view lab/test results, encounter notes, upcoming appointments, etc.  Non-urgent messages can be sent to your provider as well.   To learn more about what you can do with MyChart, go to [email or web address].    Your next appointment:   6 month(s)  The format for your next appointment:   In Person  Provider:   Jyl Heinz, MD   Other Instructions Nitroglycerin sublingual tablets What is this medicine? NITROGLYCERIN (nye troe GLI ser in) is a type of vasodilator. It relaxes blood vessels, increasing the blood and oxygen supply to your heart. This medicine is used to relieve chest pain caused by angina. It is also used to prevent chest pain before activities like climbing stairs, going outdoors in cold weather, or sexual activity. This medicine may be used for other purposes; ask your health care provider or pharmacist if you have questions. COMMON BRAND NAME(S): Nitroquick, Nitrostat, Nitrotab What should I tell my health care provider before I take this medicine? They need to know if you have any of these conditions:  anemia  head injury, recent stroke, or bleeding  in the brain  liver disease  previous heart attack  an unusual or allergic reaction to nitroglycerin, other medicines, foods, dyes, or preservatives  pregnant or trying to get pregnant  breast-feeding How should I use this medicine? Take this medicine by mouth as needed. At the first sign of an angina attack (chest pain or tightness) place one  tablet under your tongue. You can also take this medicine 5 to 10 minutes before an event likely to produce chest pain. Follow the directions on the prescription label. Let the tablet dissolve under the tongue. Do not swallow whole. Replace the dose if you accidentally swallow it. It will help if your mouth is not dry. Saliva around the tablet will help it to dissolve more quickly. Do not eat or drink, smoke or chew tobacco while a tablet is dissolving. If you are not better within 5 minutes after taking ONE dose of nitroglycerin, call 9-1-1 immediately to seek emergency medical care. Do not take more than 3 nitroglycerin tablets over 15 minutes. If you take this medicine often to relieve symptoms of angina, your doctor or health care professional may provide you with different instructions to manage your symptoms. If symptoms do not go away after following these instructions, it is important to call 9-1-1 immediately. Do not take more than 3 nitroglycerin tablets over 15 minutes. Talk to your pediatrician regarding the use of this medicine in children. Special care may be needed. Overdosage: If you think you have taken too much of this medicine contact a poison control center or emergency room at once. NOTE: This medicine is only for you. Do not share this medicine with others. What if I miss a dose? This does not apply. This medicine is only used as needed. What may interact with this medicine? Do not take this medicine with any of the following medications:  certain migraine medicines like ergotamine and dihydroergotamine (DHE)  medicines used to treat erectile dysfunction like sildenafil, tadalafil, and vardenafil  riociguat This medicine may also interact with the following medications:  alteplase  aspirin  heparin  medicines for high blood pressure  medicines for mental depression  other medicines used to treat angina  phenothiazines like chlorpromazine, mesoridazine,  prochlorperazine, thioridazine This list may not describe all possible interactions. Give your health care provider a list of all the medicines, herbs, non-prescription drugs, or dietary supplements you use. Also tell them if you smoke, drink alcohol, or use illegal drugs. Some items may interact with your medicine. What should I watch for while using this medicine? Tell your doctor or health care professional if you feel your medicine is no longer working. Keep this medicine with you at all times. Sit or lie down when you take your medicine to prevent falling if you feel dizzy or faint after using it. Try to remain calm. This will help you to feel better faster. If you feel dizzy, take several deep breaths and lie down with your feet propped up, or bend forward with your head resting between your knees. You may get drowsy or dizzy. Do not drive, use machinery, or do anything that needs mental alertness until you know how this drug affects you. Do not stand or sit up quickly, especially if you are an older patient. This reduces the risk of dizzy or fainting spells. Alcohol can make you more drowsy and dizzy. Avoid alcoholic drinks. Do not treat yourself for coughs, colds, or pain while you are taking this medicine without asking your doctor or  health care professional for advice. Some ingredients may increase your blood pressure. What side effects may I notice from receiving this medicine? Side effects that you should report to your doctor or health care professional as soon as possible:  blurred vision  dry mouth  skin rash  sweating  the feeling of extreme pressure in the head  unusually weak or tired Side effects that usually do not require medical attention (report to your doctor or health care professional if they continue or are bothersome):  flushing of the face or neck  headache  irregular heartbeat, palpitations  nausea, vomiting This list may not describe all possible side  effects. Call your doctor for medical advice about side effects. You may report side effects to FDA at 1-800-FDA-1088. Where should I keep my medicine? Keep out of the reach of children. Store at room temperature between 20 and 25 degrees C (68 and 77 degrees F). Store in [facility] of Staff. Protect from light and moisture. Keep tightly closed. Throw away any unused medicine after the expiration date. NOTE: This sheet is a summary. It may not cover all possible information. If you have questions about this medicine, talk to your doctor, pharmacist, or health care provider.  2020 Elsevier/Gold Standard (2013-03-07 17:57:36)  Cardiac Nuclear Scan A cardiac nuclear scan is a test that is done to check the flow of blood to your heart. It is done when you are resting and when you are exercising. The test looks for problems such as:  Not enough blood reaching a portion of the heart.  The heart muscle not working as it should. You may need this test if:  You have heart disease.  You have had lab results that are not normal.  You have had heart surgery or a balloon procedure to open up blocked arteries (angioplasty).  You have chest pain.  You have shortness of breath. In this test, a special dye (tracer) is put into your bloodstream. The tracer will travel to your heart. A camera will then take pictures of your heart to see how the tracer moves through your heart. This test is usually done at a hospital and takes 2-4 hours. Tell a doctor about:  Any allergies you have.  All medicines you are taking, including vitamins, herbs, eye drops, creams, and over-the-counter medicines.  Any problems you or family members have had with anesthetic medicines.  Any blood disorders you have.  Any surgeries you have had.  Any medical conditions you have.  Whether you are pregnant or may be pregnant. What are the risks? Generally, this is a safe test. However, problems may occur, such  as:  Serious chest pain and heart attack. This is only a risk if the stress portion of the test is done.  Rapid heartbeat.  A feeling of warmth in your chest. This feeling usually does not last long.  Allergic reaction to the tracer. What happens before the test?  Ask your doctor about changing or stopping your normal medicines. This is important.  Follow instructions from your doctor about what you cannot eat or drink.  Remove your jewelry on the day of the test. What happens during the test?  An IV tube will be inserted into one of your veins.  Your doctor will give you a small amount of tracer through the IV tube.  You will wait for 20-40 minutes while the tracer moves through your bloodstream.  Your heart will be monitored with an electrocardiogram (ECG).  You will lie  down on an exam table.  Pictures of your heart will be taken for about 15-20 minutes.  You may also have a stress test. For this test, one of these things may be done: ? You will be asked to exercise on a treadmill or a stationary bike. ? You will be given medicines that will make your heart work harder. This is done if you are unable to exercise.  When blood flow to your heart has peaked, a tracer will again be given through the IV tube.  After 20-40 minutes, you will get back on the exam table. More pictures will be taken of your heart.  Depending on the tracer that is used, more pictures may need to be taken 3-4 hours later.  Your IV tube will be removed when the test is over. The test may vary among doctors and hospitals. What happens after the test?  Ask your doctor: ? Whether you can return to your normal schedule, including diet, activities, and medicines. ? Whether you should drink more fluids. This will help to remove the tracer from your body. Drink enough fluid to keep your pee (urine) pale yellow.  Ask your doctor, or the department that is doing the test: ? When will my results be  ready? ? How will I get my results? Summary  A cardiac nuclear scan is a test that is done to check the flow of blood to your heart.  Tell your doctor whether you are pregnant or may be pregnant.  Before the test, ask your doctor about changing or stopping your normal medicines. This is important.  Ask your doctor whether you can return to your normal activities. You may be asked to drink more fluids. This information is not intended to replace advice given to you by your health care provider. Make sure you discuss any questions you have with your health care provider. Document Revised: 08/29/2018 Document Reviewed: 10/23/2017 Elsevier Patient Education  [place].
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
## 2020-03-19 ENCOUNTER — Ambulatory Visit: Payer: Medicare HMO | Admitting: Cardiology

## 2020-03-19 ENCOUNTER — Telehealth: Payer: Self-pay | Admitting: Cardiology

## 2020-03-19 NOTE — Telephone Encounter (Signed)
Patient called and said it was very important for Dr. Geraldo Pitter or nurse to give her a call.

## 2020-03-19 NOTE — Telephone Encounter (Signed)
I have already discussed this with her but please remind her that if she has any significant issues to go to the emergency room.

## 2020-03-19 NOTE — Telephone Encounter (Signed)
Spoke with the patient who would like to know if her EKG from her visit showed anything different. I advised her that no changes were seen on her EKG. She also states that her heart is still "sore". Patient denies any SOB, dizziness, chest pain/tightness. She states that it is just achy. Patient is aware of nitroglycerin use and ER precautions as discussed at her visit on 10/26. She states that she has severe acid reflux. Patient also states that she has lost a lot of family members over the past year and has been stressed. I advised the patient to reach out to her PCP to see if she can help with the stress/anxiety or refer her to see a therapist if needed. Patient verbalized understanding.

## 2020-03-20 NOTE — Telephone Encounter (Signed)
Recommendation given to the pt as per Dr. Geraldo Pitter. Pt states that she is not taking the 2nd COVID shot due to the issues she is having. Pt verbalized understanding and had no additional questions.

## 2020-03-24 ENCOUNTER — Telehealth (HOSPITAL_COMMUNITY): Payer: Self-pay | Admitting: *Deleted

## 2020-03-24 DIAGNOSIS — E559 Vitamin D deficiency, unspecified: Secondary | ICD-10-CM | POA: Diagnosis not present

## 2020-03-24 DIAGNOSIS — E785 Hyperlipidemia, unspecified: Secondary | ICD-10-CM | POA: Diagnosis not present

## 2020-03-24 DIAGNOSIS — R002 Palpitations: Secondary | ICD-10-CM | POA: Diagnosis not present

## 2020-03-24 DIAGNOSIS — N760 Acute vaginitis: Secondary | ICD-10-CM | POA: Diagnosis not present

## 2020-03-24 DIAGNOSIS — Z79899 Other long term (current) drug therapy: Secondary | ICD-10-CM | POA: Diagnosis not present

## 2020-03-24 DIAGNOSIS — R300 Dysuria: Secondary | ICD-10-CM | POA: Diagnosis not present

## 2020-03-24 DIAGNOSIS — J45909 Unspecified asthma, uncomplicated: Secondary | ICD-10-CM | POA: Diagnosis not present

## 2020-03-24 DIAGNOSIS — Z2821 Immunization not carried out because of patient refusal: Secondary | ICD-10-CM | POA: Diagnosis not present

## 2020-03-24 NOTE — Telephone Encounter (Signed)
Left message on voicemail per DPR in reference to upcoming appointment scheduled on 03/31/20 at 11:00 with detailed instructions given per Myocardial Perfusion Study Information Sheet for the test. LM to arrive 15 minutes early, and that it is imperative to arrive on time for appointment to keep from having the test rescheduled. If you need to cancel or reschedule your appointment, please call the office within 24 hours of your appointment. Failure to do so may result in a cancellation of your appointment, and a $50 no show fee. Phone number given for call back for any questions.

## 2020-04-10 DIAGNOSIS — I10 Essential (primary) hypertension: Secondary | ICD-10-CM | POA: Diagnosis not present

## 2020-04-10 DIAGNOSIS — F418 Other specified anxiety disorders: Secondary | ICD-10-CM | POA: Diagnosis not present

## 2020-04-10 DIAGNOSIS — J309 Allergic rhinitis, unspecified: Secondary | ICD-10-CM | POA: Diagnosis not present

## 2020-04-10 DIAGNOSIS — J45909 Unspecified asthma, uncomplicated: Secondary | ICD-10-CM | POA: Diagnosis not present

## 2020-04-10 DIAGNOSIS — E559 Vitamin D deficiency, unspecified: Secondary | ICD-10-CM | POA: Diagnosis not present

## 2020-04-21 DIAGNOSIS — J45909 Unspecified asthma, uncomplicated: Secondary | ICD-10-CM | POA: Diagnosis not present

## 2020-04-28 DIAGNOSIS — Z6835 Body mass index (BMI) 35.0-35.9, adult: Secondary | ICD-10-CM | POA: Diagnosis not present

## 2020-04-28 DIAGNOSIS — R300 Dysuria: Secondary | ICD-10-CM | POA: Diagnosis not present

## 2020-04-28 DIAGNOSIS — E669 Obesity, unspecified: Secondary | ICD-10-CM | POA: Diagnosis not present

## 2020-04-28 DIAGNOSIS — N952 Postmenopausal atrophic vaginitis: Secondary | ICD-10-CM | POA: Diagnosis not present

## 2020-04-28 DIAGNOSIS — M1711 Unilateral primary osteoarthritis, right knee: Secondary | ICD-10-CM | POA: Diagnosis not present

## 2020-05-07 DIAGNOSIS — J45909 Unspecified asthma, uncomplicated: Secondary | ICD-10-CM | POA: Diagnosis not present

## 2020-05-12 DIAGNOSIS — E669 Obesity, unspecified: Secondary | ICD-10-CM | POA: Diagnosis not present

## 2020-05-12 DIAGNOSIS — Z6835 Body mass index (BMI) 35.0-35.9, adult: Secondary | ICD-10-CM | POA: Diagnosis not present

## 2020-05-12 DIAGNOSIS — Z2821 Immunization not carried out because of patient refusal: Secondary | ICD-10-CM | POA: Diagnosis not present

## 2020-05-12 DIAGNOSIS — K219 Gastro-esophageal reflux disease without esophagitis: Secondary | ICD-10-CM | POA: Diagnosis not present

## 2020-05-12 DIAGNOSIS — G4730 Sleep apnea, unspecified: Secondary | ICD-10-CM | POA: Diagnosis not present

## 2020-05-12 DIAGNOSIS — F418 Other specified anxiety disorders: Secondary | ICD-10-CM | POA: Diagnosis not present

## 2020-05-12 DIAGNOSIS — E785 Hyperlipidemia, unspecified: Secondary | ICD-10-CM | POA: Diagnosis not present

## 2020-05-12 DIAGNOSIS — I10 Essential (primary) hypertension: Secondary | ICD-10-CM | POA: Diagnosis not present

## 2020-06-02 DIAGNOSIS — K219 Gastro-esophageal reflux disease without esophagitis: Secondary | ICD-10-CM | POA: Diagnosis not present

## 2020-06-02 DIAGNOSIS — R1010 Upper abdominal pain, unspecified: Secondary | ICD-10-CM | POA: Diagnosis not present

## 2020-06-12 DIAGNOSIS — E669 Obesity, unspecified: Secondary | ICD-10-CM | POA: Diagnosis not present

## 2020-06-12 DIAGNOSIS — K7581 Nonalcoholic steatohepatitis (NASH): Secondary | ICD-10-CM | POA: Diagnosis not present

## 2020-06-12 DIAGNOSIS — F419 Anxiety disorder, unspecified: Secondary | ICD-10-CM | POA: Diagnosis not present

## 2020-06-12 DIAGNOSIS — Z6835 Body mass index (BMI) 35.0-35.9, adult: Secondary | ICD-10-CM | POA: Diagnosis not present

## 2020-06-12 DIAGNOSIS — F32A Depression, unspecified: Secondary | ICD-10-CM | POA: Diagnosis not present

## 2020-06-12 DIAGNOSIS — R600 Localized edema: Secondary | ICD-10-CM | POA: Diagnosis not present

## 2020-06-12 DIAGNOSIS — K219 Gastro-esophageal reflux disease without esophagitis: Secondary | ICD-10-CM | POA: Diagnosis not present

## 2020-07-02 DIAGNOSIS — Z8673 Personal history of transient ischemic attack (TIA), and cerebral infarction without residual deficits: Secondary | ICD-10-CM | POA: Diagnosis not present

## 2020-07-02 DIAGNOSIS — I10 Essential (primary) hypertension: Secondary | ICD-10-CM | POA: Diagnosis not present

## 2020-07-02 DIAGNOSIS — I4891 Unspecified atrial fibrillation: Secondary | ICD-10-CM | POA: Diagnosis not present

## 2020-07-02 DIAGNOSIS — R002 Palpitations: Secondary | ICD-10-CM | POA: Diagnosis not present

## 2020-07-02 DIAGNOSIS — Z87891 Personal history of nicotine dependence: Secondary | ICD-10-CM | POA: Diagnosis not present

## 2020-07-02 LAB — PROTIME-INR: INR: 0.9 (ref 0.9–1.1)

## 2020-07-07 ENCOUNTER — Other Ambulatory Visit: Payer: Self-pay

## 2020-07-08 ENCOUNTER — Ambulatory Visit (INDEPENDENT_AMBULATORY_CARE_PROVIDER_SITE_OTHER): Payer: Medicare HMO | Admitting: Cardiology

## 2020-07-08 ENCOUNTER — Encounter: Payer: Self-pay | Admitting: Cardiology

## 2020-07-08 ENCOUNTER — Other Ambulatory Visit: Payer: Self-pay

## 2020-07-08 VITALS — BP 122/64 | HR 88 | Ht 61.0 in | Wt 192.0 lb

## 2020-07-08 DIAGNOSIS — I2510 Atherosclerotic heart disease of native coronary artery without angina pectoris: Secondary | ICD-10-CM | POA: Diagnosis not present

## 2020-07-08 DIAGNOSIS — R002 Palpitations: Secondary | ICD-10-CM | POA: Diagnosis not present

## 2020-07-08 DIAGNOSIS — E782 Mixed hyperlipidemia: Secondary | ICD-10-CM

## 2020-07-08 DIAGNOSIS — E669 Obesity, unspecified: Secondary | ICD-10-CM

## 2020-07-08 HISTORY — DX: Obesity, unspecified: E66.9

## 2020-07-08 NOTE — Patient Instructions (Signed)
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
## 2020-07-14 DIAGNOSIS — N343 Urethral syndrome, unspecified: Secondary | ICD-10-CM | POA: Diagnosis not present

## 2020-07-14 DIAGNOSIS — H6090 Unspecified otitis externa, unspecified ear: Secondary | ICD-10-CM | POA: Diagnosis not present

## 2020-07-14 DIAGNOSIS — F418 Other specified anxiety disorders: Secondary | ICD-10-CM | POA: Diagnosis not present

## 2020-07-14 DIAGNOSIS — N952 Postmenopausal atrophic vaginitis: Secondary | ICD-10-CM | POA: Diagnosis not present

## 2020-07-14 DIAGNOSIS — R002 Palpitations: Secondary | ICD-10-CM | POA: Diagnosis not present

## 2020-07-14 DIAGNOSIS — Z09 Encounter for follow-up examination after completed treatment for conditions other than malignant neoplasm: Secondary | ICD-10-CM | POA: Diagnosis not present

## 2020-07-14 DIAGNOSIS — H1010 Acute atopic conjunctivitis, unspecified eye: Secondary | ICD-10-CM | POA: Diagnosis not present

## 2020-07-14 DIAGNOSIS — Z6834 Body mass index (BMI) 34.0-34.9, adult: Secondary | ICD-10-CM | POA: Diagnosis not present

## 2020-07-24 ENCOUNTER — Telehealth: Payer: Self-pay | Admitting: Cardiology

## 2020-07-24 NOTE — Telephone Encounter (Signed)
    Pt c/o medication issue:  1. Name of Medication: xanax  2. How are you currently taking this medication (dosage and times per day)?   3. Are you having a reaction (difficulty breathing--STAT)?   4. What is your medication issue? Pt said every morning she feels chest discomfort that lasted for hours, her niece said it's probably because she is taking xanax with her heart meds. She also said her legs starting to hurt and getting a headache. She doesn't know what to do. She would like to get a call back today

## 2020-07-24 NOTE — Telephone Encounter (Signed)
Called patient. Informed her she can half the tablet of propranolol for the next 3 days then stop it. She will do this. If she has any of significant issues I advised her to go to the emergency department. She understood no further questions.

## 2020-07-24 NOTE — Telephone Encounter (Signed)
She could take half of the propranolol tablet for the next 3 days and then stop it.  If it is really bothering her she can stop it right away but it could result in sudden increase in blood pressure.  If she has any significant issues she needs to go to the emergency room.

## 2020-07-24 NOTE — Telephone Encounter (Signed)
Called and spoke to patient. She reports for the past few weeks since starting propanolol she has been having chest discomfort in the mornings. She is unsure if the pain radiates as her back always hurts. She is a little more short of breath an normal. She reports headaches, and dizziness at times, and blurred vision but she has had blurred vision for a long time this is nothing new for her. She has gained weight but unsure of how much. She reports leg pain as well but no swelling. No current chest pain. Blood pressure on the phone with me is 146/86 and heart rate 87. She is worried her xanax and propanolol are causing her issues as someone told her in her family  that can interact with each other. She wants to know if she should still take the propranolol. She has appointment with Dr. Geraldo Pitter on Tuesday  07/28/20. Will consult wit him.

## 2020-07-27 ENCOUNTER — Other Ambulatory Visit: Payer: Self-pay

## 2020-07-28 ENCOUNTER — Other Ambulatory Visit: Payer: Self-pay

## 2020-07-28 ENCOUNTER — Encounter: Payer: Self-pay | Admitting: Cardiology

## 2020-07-28 ENCOUNTER — Ambulatory Visit (INDEPENDENT_AMBULATORY_CARE_PROVIDER_SITE_OTHER): Payer: Medicare HMO | Admitting: Cardiology

## 2020-07-28 VITALS — BP 124/76 | HR 101 | Ht 61.0 in | Wt 190.8 lb

## 2020-07-28 DIAGNOSIS — R002 Palpitations: Secondary | ICD-10-CM

## 2020-07-28 DIAGNOSIS — E669 Obesity, unspecified: Secondary | ICD-10-CM | POA: Diagnosis not present

## 2020-07-28 DIAGNOSIS — E782 Mixed hyperlipidemia: Secondary | ICD-10-CM

## 2020-07-28 DIAGNOSIS — I2510 Atherosclerotic heart disease of native coronary artery without angina pectoris: Secondary | ICD-10-CM
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
## 2020-07-28 NOTE — Patient Instructions (Signed)

## 2020-07-31 ENCOUNTER — Ambulatory Visit: Payer: Medicare HMO | Admitting: Cardiology

## 2020-08-05 DIAGNOSIS — R109 Unspecified abdominal pain: Secondary | ICD-10-CM | POA: Diagnosis not present

## 2020-08-05 DIAGNOSIS — R1084 Generalized abdominal pain: Secondary | ICD-10-CM | POA: Diagnosis not present

## 2020-08-05 DIAGNOSIS — R1012 Left upper quadrant pain: Secondary | ICD-10-CM | POA: Diagnosis not present

## 2020-08-05 DIAGNOSIS — I700 Atherosclerosis of aorta: Secondary | ICD-10-CM | POA: Diagnosis not present

## 2020-08-05 DIAGNOSIS — R918 Other nonspecific abnormal finding of lung field: Secondary | ICD-10-CM | POA: Diagnosis not present

## 2020-08-07 DIAGNOSIS — F418 Other specified anxiety disorders: Secondary | ICD-10-CM | POA: Diagnosis not present

## 2020-08-07 DIAGNOSIS — I10 Essential (primary) hypertension: Secondary | ICD-10-CM | POA: Diagnosis not present

## 2020-08-07 DIAGNOSIS — E669 Obesity, unspecified: Secondary | ICD-10-CM | POA: Diagnosis not present

## 2020-08-07 DIAGNOSIS — Z6835 Body mass index (BMI) 35.0-35.9, adult: Secondary | ICD-10-CM | POA: Diagnosis not present

## 2020-08-07 DIAGNOSIS — K219 Gastro-esophageal reflux disease without esophagitis: Secondary | ICD-10-CM | POA: Diagnosis not present

## 2020-09-02 DIAGNOSIS — F418 Other specified anxiety disorders: Secondary | ICD-10-CM | POA: Diagnosis not present

## 2020-09-02 DIAGNOSIS — J309 Allergic rhinitis, unspecified: Secondary | ICD-10-CM | POA: Diagnosis not present

## 2020-09-02 DIAGNOSIS — R935 Abnormal findings on diagnostic imaging of other abdominal regions, including retroperitoneum: Secondary | ICD-10-CM | POA: Diagnosis not present

## 2020-09-02 DIAGNOSIS — E669 Obesity, unspecified: Secondary | ICD-10-CM | POA: Diagnosis not present

## 2020-09-02 DIAGNOSIS — N390 Urinary tract infection, site not specified: Secondary | ICD-10-CM | POA: Diagnosis not present

## 2020-09-02 DIAGNOSIS — Z6835 Body mass index (BMI) 35.0-35.9, adult: Secondary | ICD-10-CM | POA: Diagnosis not present

## 2020-09-23 DIAGNOSIS — Z6836 Body mass index (BMI) 36.0-36.9, adult: Secondary | ICD-10-CM | POA: Diagnosis not present

## 2020-09-23 DIAGNOSIS — R3915 Urgency of urination: Secondary | ICD-10-CM | POA: Diagnosis not present

## 2020-09-23 DIAGNOSIS — K5792 Diverticulitis of intestine, part unspecified, without perforation or abscess without bleeding: Secondary | ICD-10-CM | POA: Diagnosis not present

## 2020-10-05 DIAGNOSIS — Z6834 Body mass index (BMI) 34.0-34.9, adult: Secondary | ICD-10-CM | POA: Diagnosis not present

## 2020-10-05 DIAGNOSIS — F418 Other specified anxiety disorders: Secondary | ICD-10-CM | POA: Diagnosis not present

## 2020-10-05 DIAGNOSIS — J309 Allergic rhinitis, unspecified: Secondary | ICD-10-CM | POA: Diagnosis not present

## 2020-10-05 DIAGNOSIS — I10 Essential (primary) hypertension: Secondary | ICD-10-CM | POA: Diagnosis not present

## 2020-10-05 DIAGNOSIS — N952 Postmenopausal atrophic vaginitis: Secondary | ICD-10-CM | POA: Diagnosis not present

## 2020-11-04 DIAGNOSIS — Z6835 Body mass index (BMI) 35.0-35.9, adult: Secondary | ICD-10-CM | POA: Diagnosis not present

## 2020-11-04 DIAGNOSIS — F418 Other specified anxiety disorders: Secondary | ICD-10-CM | POA: Diagnosis not present

## 2020-11-04 DIAGNOSIS — E669 Obesity, unspecified: Secondary | ICD-10-CM | POA: Diagnosis not present

## 2020-11-04 DIAGNOSIS — K219 Gastro-esophageal reflux disease without esophagitis: Secondary | ICD-10-CM | POA: Diagnosis not present

## 2020-11-04 DIAGNOSIS — I10 Essential (primary) hypertension: Secondary | ICD-10-CM | POA: Diagnosis not present

## 2020-11-04 DIAGNOSIS — R300 Dysuria: Secondary | ICD-10-CM | POA: Diagnosis not present

## 2020-11-06 DIAGNOSIS — R300 Dysuria: Secondary | ICD-10-CM | POA: Diagnosis not present

## 2020-11-27 DIAGNOSIS — I10 Essential (primary) hypertension: Secondary | ICD-10-CM | POA: Diagnosis not present

## 2020-11-27 DIAGNOSIS — Z6836 Body mass index (BMI) 36.0-36.9, adult: Secondary | ICD-10-CM | POA: Diagnosis not present

## 2020-11-27 DIAGNOSIS — N760 Acute vaginitis: Secondary | ICD-10-CM | POA: Diagnosis not present

## 2020-11-27 DIAGNOSIS — N952 Postmenopausal atrophic vaginitis: Secondary | ICD-10-CM | POA: Diagnosis not present

## 2020-11-27 DIAGNOSIS — E669 Obesity, unspecified: Secondary | ICD-10-CM | POA: Diagnosis not present

## 2020-11-27 DIAGNOSIS — F418 Other specified anxiety disorders: Secondary | ICD-10-CM | POA: Diagnosis not present

## 2020-11-27 DIAGNOSIS — J309 Allergic rhinitis, unspecified: Secondary | ICD-10-CM | POA: Diagnosis not present

## 2020-11-27 DIAGNOSIS — Z6835 Body mass index (BMI) 35.0-35.9, adult: Secondary | ICD-10-CM | POA: Diagnosis not present

## 2020-12-22 DIAGNOSIS — Z1331 Encounter for screening for depression: Secondary | ICD-10-CM | POA: Diagnosis not present

## 2020-12-22 DIAGNOSIS — Z0000 Encounter for general adult medical examination without abnormal findings: Secondary | ICD-10-CM | POA: Diagnosis not present

## 2020-12-22 DIAGNOSIS — Z9181 History of falling: Secondary | ICD-10-CM | POA: Diagnosis not present

## 2020-12-22 DIAGNOSIS — E785 Hyperlipidemia, unspecified: Secondary | ICD-10-CM | POA: Diagnosis not present

## 2020-12-22 DIAGNOSIS — E669 Obesity, unspecified: Secondary | ICD-10-CM | POA: Diagnosis not present

## 2020-12-29 DIAGNOSIS — R300 Dysuria: Secondary | ICD-10-CM | POA: Diagnosis not present

## 2020-12-29 DIAGNOSIS — Z6836 Body mass index (BMI) 36.0-36.9, adult: Secondary | ICD-10-CM | POA: Diagnosis not present

## 2020-12-29 DIAGNOSIS — E669 Obesity, unspecified: Secondary | ICD-10-CM | POA: Diagnosis not present

## 2020-12-29 DIAGNOSIS — Z79899 Other long term (current) drug therapy: Secondary | ICD-10-CM | POA: Diagnosis not present

## 2020-12-29 DIAGNOSIS — E785 Hyperlipidemia, unspecified: Secondary | ICD-10-CM | POA: Diagnosis not present

## 2020-12-29 DIAGNOSIS — E559 Vitamin D deficiency, unspecified: Secondary | ICD-10-CM | POA: Diagnosis not present

## 2020-12-29 DIAGNOSIS — F419 Anxiety disorder, unspecified: Secondary | ICD-10-CM | POA: Diagnosis not present

## 2021-01-05 ENCOUNTER — Ambulatory Visit: Payer: Medicare HMO | Admitting: Cardiology

## 2021-01-08 DIAGNOSIS — R197 Diarrhea, unspecified: Secondary | ICD-10-CM | POA: Diagnosis not present

## 2021-01-08 DIAGNOSIS — R1031 Right lower quadrant pain: Secondary | ICD-10-CM | POA: Diagnosis not present

## 2021-01-08 DIAGNOSIS — K219 Gastro-esophageal reflux disease without esophagitis: Secondary | ICD-10-CM | POA: Diagnosis not present

## 2021-01-14 DIAGNOSIS — K7689 Other specified diseases of liver: Secondary | ICD-10-CM | POA: Diagnosis not present

## 2021-01-14 DIAGNOSIS — R918 Other nonspecific abnormal finding of lung field: Secondary | ICD-10-CM | POA: Diagnosis not present

## 2021-01-14 DIAGNOSIS — I700 Atherosclerosis of aorta: Secondary | ICD-10-CM | POA: Diagnosis not present

## 2021-01-14 DIAGNOSIS — R1031 Right lower quadrant pain: Secondary | ICD-10-CM | POA: Diagnosis not present

## 2021-02-04 DIAGNOSIS — F418 Other specified anxiety disorders: Secondary | ICD-10-CM | POA: Diagnosis not present

## 2021-02-04 DIAGNOSIS — E669 Obesity, unspecified: Secondary | ICD-10-CM | POA: Diagnosis not present

## 2021-02-04 DIAGNOSIS — Z6835 Body mass index (BMI) 35.0-35.9, adult: Secondary | ICD-10-CM | POA: Diagnosis not present

## 2021-02-04 DIAGNOSIS — K219 Gastro-esophageal reflux disease without esophagitis: Secondary | ICD-10-CM | POA: Diagnosis not present

## 2021-02-04 DIAGNOSIS — I10 Essential (primary) hypertension: Secondary | ICD-10-CM | POA: Diagnosis not present

## 2021-03-04 DIAGNOSIS — I10 Essential (primary) hypertension: Secondary | ICD-10-CM | POA: Diagnosis not present

## 2021-03-04 DIAGNOSIS — Z79899 Other long term (current) drug therapy: Secondary | ICD-10-CM | POA: Diagnosis not present

## 2021-03-04 DIAGNOSIS — E669 Obesity, unspecified: Secondary | ICD-10-CM | POA: Diagnosis not present

## 2021-03-04 DIAGNOSIS — F418 Other specified anxiety disorders: Secondary | ICD-10-CM | POA: Diagnosis not present

## 2021-03-04 DIAGNOSIS — R300 Dysuria: Secondary | ICD-10-CM | POA: Diagnosis not present

## 2021-03-04 DIAGNOSIS — Z6835 Body mass index (BMI) 35.0-35.9, adult: Secondary | ICD-10-CM | POA: Diagnosis not present

## 2021-03-04 DIAGNOSIS — N952 Postmenopausal atrophic vaginitis: Secondary | ICD-10-CM | POA: Diagnosis not present

## 2021-03-04 DIAGNOSIS — R21 Rash and other nonspecific skin eruption: Secondary | ICD-10-CM | POA: Diagnosis not present

## 2021-03-17 DIAGNOSIS — E785 Hyperlipidemia, unspecified: Secondary | ICD-10-CM | POA: Diagnosis not present

## 2021-03-17 DIAGNOSIS — E669 Obesity, unspecified: Secondary | ICD-10-CM | POA: Diagnosis not present

## 2021-03-17 DIAGNOSIS — E559 Vitamin D deficiency, unspecified: Secondary | ICD-10-CM | POA: Diagnosis not present

## 2021-03-17 DIAGNOSIS — J45909 Unspecified asthma, uncomplicated: Secondary | ICD-10-CM | POA: Diagnosis not present

## 2021-03-17 DIAGNOSIS — Z6836 Body mass index (BMI) 36.0-36.9, adult: Secondary | ICD-10-CM | POA: Diagnosis not present

## 2021-03-17 DIAGNOSIS — Z79899 Other long term (current) drug therapy: Secondary | ICD-10-CM | POA: Diagnosis not present

## 2021-03-31 ENCOUNTER — Telehealth: Payer: Self-pay | Admitting: Cardiology

## 2021-03-31 NOTE — Telephone Encounter (Signed)
Spoke with pt who states that she had a "hard" left chest pain on Monday, Tuesday she had soreness and pain in her chest and today she is having soreness under her left breast. Pt states she had a little more shortness of breath than normal and feels "swimmy" headed. Pt states she did not take the nitroglycerin as she did not want to have palpations and I advised that it does not cause palpations but would help her heart. I advised her to go to the ED but she states that Dr. Geraldo Pitter told her not to go to the ED but come to the office instead. I again advised to go to the ED as they can do labs and things but she refused and states she just wants to come to the office for an EKG. How do you advise?

## 2021-03-31 NOTE — Telephone Encounter (Signed)
Left VM for pt to call back.
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
## 2021-03-31 NOTE — Telephone Encounter (Signed)
Pt c/o of Chest Pain: STAT if CP now or developed within 24 hours  1. Are you having CP right now? Yes little  2. Are you experiencing any other symptoms (ex. SOB, nausea, vomiting, sweating)? sob  3. How long have you been experiencing CP? Last night   4. Is your CP continuous or coming and going? Coming and going  5. Have you taken Nitroglycerin? no ?  Pt c/o BP issue: STAT if pt c/o blurred vision, one-sided weakness or slurred speech  1. What are your last 5 BP readings? 155/87 HR 70  2. Are you having any other symptoms (ex. Dizziness, headache, blurred vision, passed out)? no  3. What is your BP issue? Was too high

## 2021-04-01 DIAGNOSIS — I10 Essential (primary) hypertension: Secondary | ICD-10-CM | POA: Diagnosis not present

## 2021-04-01 DIAGNOSIS — F418 Other specified anxiety disorders: Secondary | ICD-10-CM | POA: Diagnosis not present

## 2021-04-01 DIAGNOSIS — K219 Gastro-esophageal reflux disease without esophagitis: Secondary | ICD-10-CM | POA: Diagnosis not present

## 2021-04-01 DIAGNOSIS — I208 Other forms of angina pectoris: Secondary | ICD-10-CM | POA: Diagnosis not present

## 2021-04-01 DIAGNOSIS — R079 Chest pain, unspecified: Secondary | ICD-10-CM | POA: Diagnosis not present

## 2021-04-01 DIAGNOSIS — Z79899 Other long term (current) drug therapy: Secondary | ICD-10-CM | POA: Diagnosis not present

## 2021-04-01 NOTE — Telephone Encounter (Signed)
Spoke with pt and advised she needed to go to the ED per Dr. Geraldo Pitter. Pt states if she starts having pain she will use her NTG and she will figure out what to do to get to another ED than [facility]. I discussed the risk of waiting and damage to her heart if she continues to wait if she is having a MI. Pt verbalized understanding and had no additional questions. Pt declined to make an appointment at a later date as she only wants to come in for a EKG.

## 2021-04-02 DIAGNOSIS — I10 Essential (primary) hypertension: Secondary | ICD-10-CM | POA: Diagnosis not present

## 2021-04-02 DIAGNOSIS — J45909 Unspecified asthma, uncomplicated: Secondary | ICD-10-CM | POA: Diagnosis not present

## 2021-04-02 DIAGNOSIS — R079 Chest pain, unspecified: Secondary | ICD-10-CM | POA: Diagnosis not present

## 2021-04-02 DIAGNOSIS — F418 Other specified anxiety disorders: Secondary | ICD-10-CM | POA: Diagnosis not present

## 2021-04-02 DIAGNOSIS — K219 Gastro-esophageal reflux disease without esophagitis: Secondary | ICD-10-CM | POA: Diagnosis not present

## 2021-04-02 DIAGNOSIS — I208 Other forms of angina pectoris: Secondary | ICD-10-CM | POA: Diagnosis not present

## 2021-04-02 DIAGNOSIS — E782 Mixed hyperlipidemia: Secondary | ICD-10-CM | POA: Diagnosis not present

## 2021-04-05 DIAGNOSIS — Z6835 Body mass index (BMI) 35.0-35.9, adult: Secondary | ICD-10-CM | POA: Diagnosis not present

## 2021-04-05 DIAGNOSIS — E669 Obesity, unspecified: Secondary | ICD-10-CM | POA: Diagnosis not present

## 2021-04-05 DIAGNOSIS — F419 Anxiety disorder, unspecified: Secondary | ICD-10-CM | POA: Diagnosis not present

## 2021-04-05 DIAGNOSIS — I10 Essential (primary) hypertension: Secondary | ICD-10-CM | POA: Diagnosis not present

## 2021-04-26 ENCOUNTER — Telehealth: Payer: Self-pay | Admitting: Cardiology

## 2021-04-26 NOTE — Telephone Encounter (Signed)
Patient reports that over the weekend she has been having palpitations and left sided chest pain that radiates to neck back and arms.  She reports the pain as soreness "like a tooth ache". She does not have any of this pain right now on the phone. She does report shortness of breath at times. All of this has been off and on for 2+ weeks, She wants to be seen by Dr. Geraldo Pitter as she feels that she needs to be on a medication for this.   She does have nitro- explained the proper use and indication for this medication. Also encouraged her to go to the emergency room if this returns before she hears back from Dr. Geraldo Pitter team. She states if the pain comes back she will try nitro and if it doesn't relieve it she will go to the emergency room.   Will send to Dr. Geraldo Pitter nurse to review tomorrow about patient being seen/new medications.

## 2021-04-26 NOTE — Telephone Encounter (Signed)
Pt c/o of Chest Pain: STAT if CP now or developed within 24 hours  1. Are you having CP right now? Angina and Palpitations- not at this minute-   2. Are you experiencing any other symptoms (ex. SOB, nausea, vomiting, sweating)?  This morning had a dizzy spell and was hot  3. How long have you been experiencing CP? Gotten worse over the weekend  4. Is your CP continuous or coming and going? Comes and goes  5. Have you taken Nitroglycerin? Patient wants to know if she should take her Nitroglycerin- patient wants to be seen tomorrow if she can be worked in  ?

## 2021-04-26 NOTE — Telephone Encounter (Signed)
Spoke to patient

## 2021-04-27 NOTE — Telephone Encounter (Signed)
Appointment made for 04/29/21

## 2021-04-28 ENCOUNTER — Other Ambulatory Visit: Payer: Self-pay

## 2021-04-29 ENCOUNTER — Other Ambulatory Visit: Payer: Self-pay

## 2021-04-29 ENCOUNTER — Ambulatory Visit (INDEPENDENT_AMBULATORY_CARE_PROVIDER_SITE_OTHER): Payer: Medicare HMO | Admitting: Cardiology

## 2021-04-29 ENCOUNTER — Encounter: Payer: Self-pay | Admitting: Cardiology

## 2021-04-29 VITALS — BP 144/76 | HR 62 | Ht 61.0 in | Wt 187.8 lb

## 2021-04-29 DIAGNOSIS — E782 Mixed hyperlipidemia: Secondary | ICD-10-CM

## 2021-04-29 DIAGNOSIS — I209 Angina pectoris, unspecified: Secondary | ICD-10-CM | POA: Diagnosis not present

## 2021-04-29 DIAGNOSIS — E669 Obesity, unspecified: Secondary | ICD-10-CM | POA: Diagnosis not present

## 2021-04-29 DIAGNOSIS — I2510 Atherosclerotic heart disease of native coronary artery without angina pectoris: Secondary | ICD-10-CM | POA: Diagnosis not present

## 2021-04-29 MED ORDER — NITROGLYCERIN 0.4 MG SL SUBL: 0.4000 mg | SUBLINGUAL_TABLET | SUBLINGUAL | 6 refills | Status: DC | PRN
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
## 2021-04-29 NOTE — Patient Instructions (Signed)

## 2021-05-03 DIAGNOSIS — E669 Obesity, unspecified: Secondary | ICD-10-CM | POA: Diagnosis not present

## 2021-05-03 DIAGNOSIS — K219 Gastro-esophageal reflux disease without esophagitis: Secondary | ICD-10-CM | POA: Diagnosis not present

## 2021-05-03 DIAGNOSIS — Z6835 Body mass index (BMI) 35.0-35.9, adult: Secondary | ICD-10-CM | POA: Diagnosis not present

## 2021-05-03 DIAGNOSIS — F419 Anxiety disorder, unspecified: Secondary | ICD-10-CM | POA: Diagnosis not present

## 2021-07-07 DIAGNOSIS — Z6836 Body mass index (BMI) 36.0-36.9, adult: Secondary | ICD-10-CM | POA: Diagnosis not present

## 2021-07-07 DIAGNOSIS — I10 Essential (primary) hypertension: Secondary | ICD-10-CM | POA: Diagnosis not present

## 2021-07-07 DIAGNOSIS — K219 Gastro-esophageal reflux disease without esophagitis: Secondary | ICD-10-CM | POA: Diagnosis not present

## 2021-07-07 DIAGNOSIS — E559 Vitamin D deficiency, unspecified: Secondary | ICD-10-CM | POA: Diagnosis not present

## 2021-07-07 DIAGNOSIS — J45909 Unspecified asthma, uncomplicated: Secondary | ICD-10-CM | POA: Diagnosis not present

## 2021-07-07 DIAGNOSIS — Z79899 Other long term (current) drug therapy: Secondary | ICD-10-CM | POA: Diagnosis not present

## 2021-07-07 DIAGNOSIS — E669 Obesity, unspecified: Secondary | ICD-10-CM | POA: Diagnosis not present

## 2021-07-15 DIAGNOSIS — E669 Obesity, unspecified: Secondary | ICD-10-CM | POA: Diagnosis not present

## 2021-07-15 DIAGNOSIS — Z6835 Body mass index (BMI) 35.0-35.9, adult: Secondary | ICD-10-CM | POA: Diagnosis not present

## 2021-07-15 DIAGNOSIS — F419 Anxiety disorder, unspecified: Secondary | ICD-10-CM | POA: Diagnosis not present

## 2021-07-15 DIAGNOSIS — I10 Essential (primary) hypertension: Secondary | ICD-10-CM | POA: Diagnosis not present

## 2021-07-15 DIAGNOSIS — R300 Dysuria: Secondary | ICD-10-CM | POA: Diagnosis not present

## 2021-08-13 DIAGNOSIS — J309 Allergic rhinitis, unspecified: Secondary | ICD-10-CM | POA: Diagnosis not present

## 2021-08-13 DIAGNOSIS — E669 Obesity, unspecified: Secondary | ICD-10-CM | POA: Diagnosis not present

## 2021-08-13 DIAGNOSIS — L089 Local infection of the skin and subcutaneous tissue, unspecified: Secondary | ICD-10-CM | POA: Diagnosis not present

## 2021-08-13 DIAGNOSIS — F419 Anxiety disorder, unspecified: Secondary | ICD-10-CM | POA: Diagnosis not present

## 2021-08-19 DIAGNOSIS — K219 Gastro-esophageal reflux disease without esophagitis: Secondary | ICD-10-CM | POA: Diagnosis not present

## 2021-08-19 DIAGNOSIS — Z79899 Other long term (current) drug therapy: Secondary | ICD-10-CM | POA: Diagnosis not present

## 2021-08-19 DIAGNOSIS — E669 Obesity, unspecified: Secondary | ICD-10-CM | POA: Diagnosis not present

## 2021-08-19 DIAGNOSIS — Z1231 Encounter for screening mammogram for malignant neoplasm of breast: Secondary | ICD-10-CM | POA: Diagnosis not present

## 2021-08-19 DIAGNOSIS — E785 Hyperlipidemia, unspecified: Secondary | ICD-10-CM | POA: Diagnosis not present

## 2021-08-19 DIAGNOSIS — E559 Vitamin D deficiency, unspecified: Secondary | ICD-10-CM | POA: Diagnosis not present

## 2021-08-25 DIAGNOSIS — B028 Zoster with other complications: Secondary | ICD-10-CM | POA: Diagnosis not present

## 2021-08-25 DIAGNOSIS — E669 Obesity, unspecified: Secondary | ICD-10-CM | POA: Diagnosis not present

## 2021-08-25 DIAGNOSIS — L308 Other specified dermatitis: Secondary | ICD-10-CM | POA: Diagnosis not present

## 2021-08-26 DIAGNOSIS — B028 Zoster with other complications: Secondary | ICD-10-CM | POA: Diagnosis not present

## 2021-09-13 DIAGNOSIS — I10 Essential (primary) hypertension: Secondary | ICD-10-CM | POA: Diagnosis not present

## 2021-09-13 DIAGNOSIS — J45909 Unspecified asthma, uncomplicated: Secondary | ICD-10-CM | POA: Diagnosis not present

## 2021-09-13 DIAGNOSIS — G4730 Sleep apnea, unspecified: Secondary | ICD-10-CM | POA: Diagnosis not present

## 2021-09-13 DIAGNOSIS — Z6835 Body mass index (BMI) 35.0-35.9, adult: Secondary | ICD-10-CM | POA: Diagnosis not present

## 2021-09-13 DIAGNOSIS — F419 Anxiety disorder, unspecified: Secondary | ICD-10-CM | POA: Diagnosis not present

## 2021-09-13 DIAGNOSIS — M7989 Other specified soft tissue disorders: Secondary | ICD-10-CM | POA: Diagnosis not present

## 2021-09-13 DIAGNOSIS — M159 Polyosteoarthritis, unspecified: Secondary | ICD-10-CM | POA: Diagnosis not present

## 2021-09-13 DIAGNOSIS — E669 Obesity, unspecified: Secondary | ICD-10-CM | POA: Diagnosis not present

## 2021-09-29 IMAGING — CT CT HEART MORP W/ CTA COR W/ SCORE W/ CA W/CM &/OR W/O CM
4 of 7 series · 8 of 20 positions shown, 9 images · IV contrast (APPLIED)
Comparison: 08/30/2010.
COMPARISON: 08/30/2010.

Addendum:
EXAM:
OVER-READ INTERPRETATION  CT CHEST

The following report is an over-read performed by radiologist Dr.
Dyven Mulet [REDACTED] on 08/28/2019. This
over-read does not include interpretation of cardiac or coronary
anatomy or pathology. The coronary calcium score/coronary CTA
interpretation by the cardiologist is attached.
TECHNIQUE: The patient was scanned on a [employer] Force scanner.

[Series 6: best diast 72 % · axial · 0.39mm/px · z∈[-159,-120]mm · 2 of 293 slices shown]
[im 98/293  vessel]
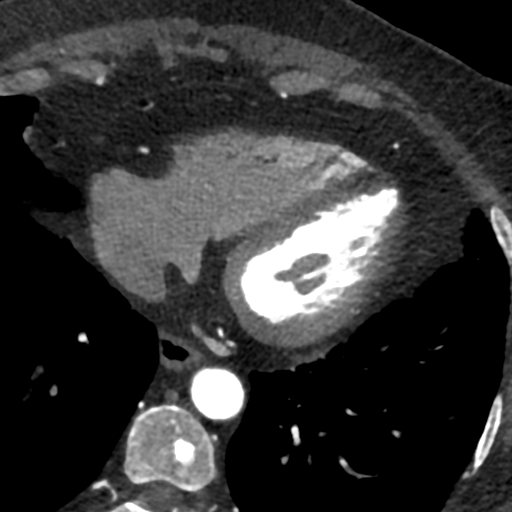
[im 195/293  vessel]
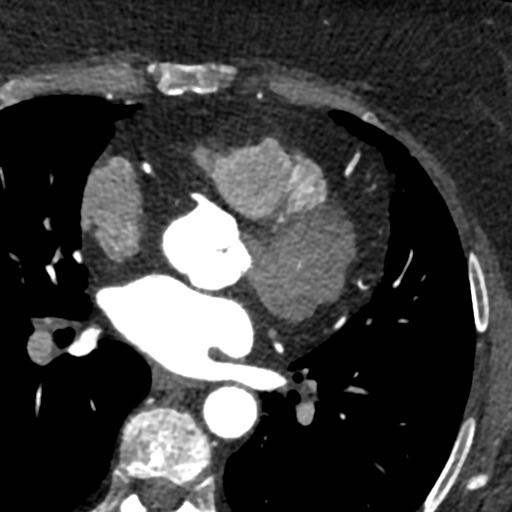

[Series 7: best syst · axial · 0.39mm/px · z∈[-159,-120]mm · 2 of 293 slices shown, 3 images]
[im 98/293  vessel]
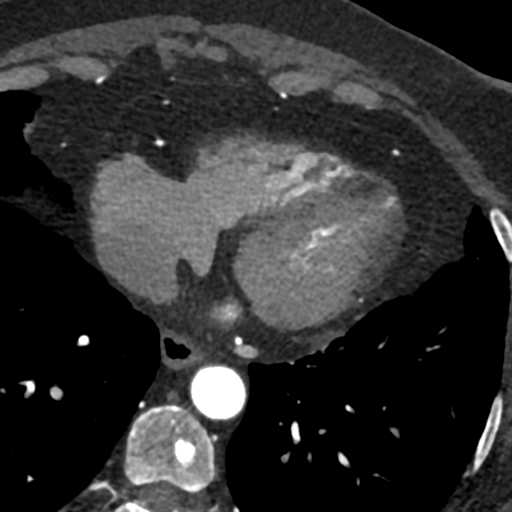
[im 98/293  lung]
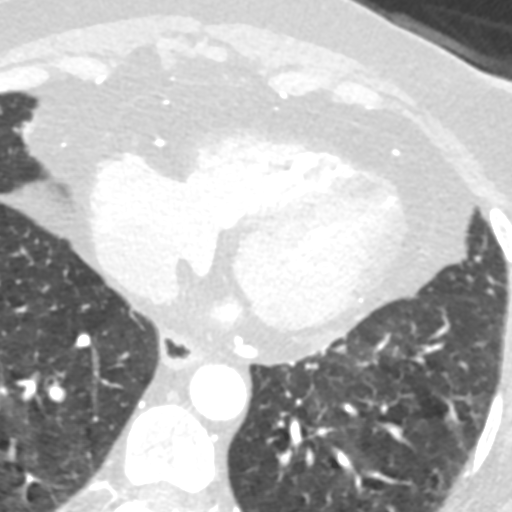
[im 195/293  vessel]
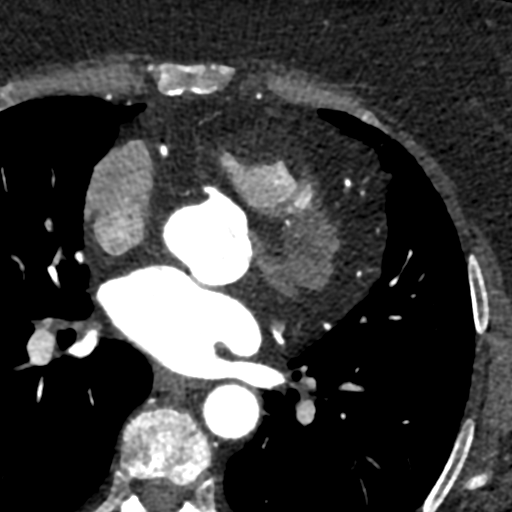

[Series 9: ts diast sharp · axial · 0.39mm/px · z∈[-159,-120]mm · 2 of 293 slices shown]
[im 98/293  lung]
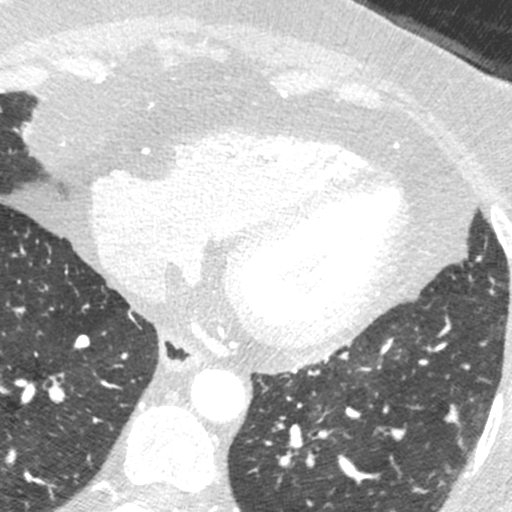
[im 195/293  lung]
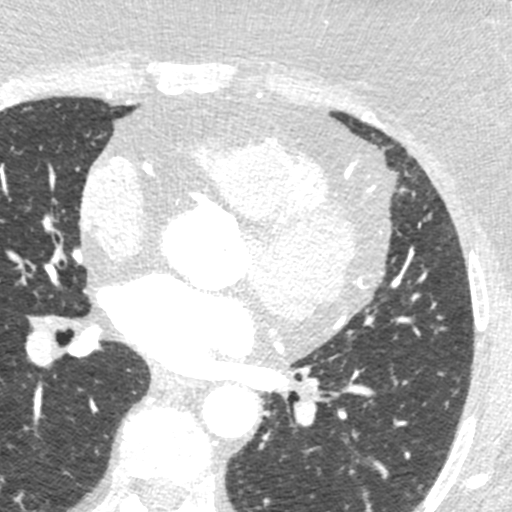

[Series 10: ts syst sharp · axial · 0.39mm/px · z∈[-159,-120]mm · 2 of 293 slices shown]
[im 98/293  lung]
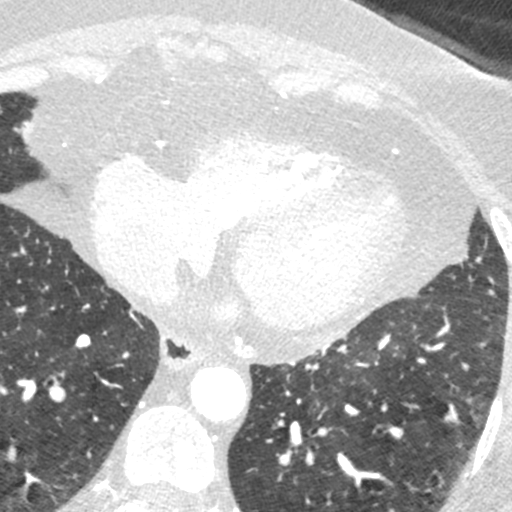
[im 195/293  lung]
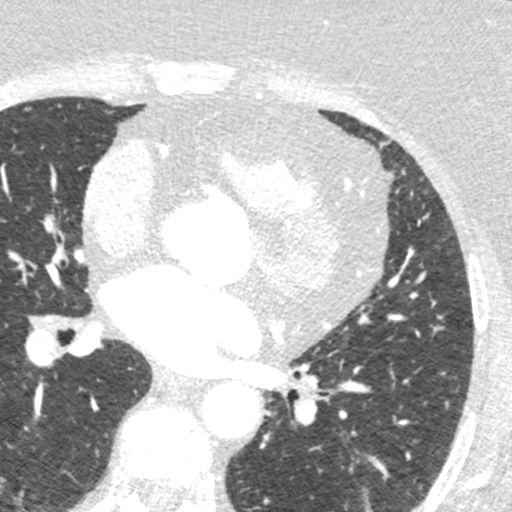

[8 of 20 positions shown; findings below may reference images not displayed]

FINDINGS: Vascular: Heart is enlarged.  No pericardial effusion.

Mediastinum/Nodes: No pathologically enlarged lymph nodes. Esophagus
is grossly unremarkable.

Lungs/Pleura: A subpleural lymph node along the minor fissure
([DATE]), unchanged. 5 mm peripheral left lower lobe nodule ([DATE])
also unchanged and benign. Scarring in the lingula.

Upper Abdomen: None.

Musculoskeletal: Degenerative changes in the spine.
IMPRESSION: No acute extracardiac findings.

EXAM:
Cardiac/Coronary  CT
FINDINGS: A 120 kV prospective scan was triggered in the descending thoracic
aorta at 111 HU's. Axial non-contrast 3 mm slices were carried out
through the heart. The data set was analyzed on a dedicated work
station and scored using the Agatson method. Gantry rotation speed
was 250 msecs and collimation was .6 mm. No beta blockade and 0.8 mg
of sl NTG was given. The 3D data set was reconstructed in 5%
intervals of the 67-82 % of the R-R cycle. Diastolic phases were
analyzed on a dedicated work station using MPR, MIP and VRT modes.
The patient received 80 cc of contrast.

Aorta:  Normal size.  Scattered calcifications.  No dissection.

Aortic Valve:  Trileaflet.  No calcifications.

Coronary Arteries:  Normal coronary origin.  Right dominance.

RCA is a large dominant artery that gives rise to PDA and PLVB.
There is no plaque.

Left main is a large artery that gives rise to LAD and LCX arteries.
There is mild calcified plaque in the distal LM with associated
stenosis of 25-49%.

LAD is a large vessel that gives rise to a large D1 and moderate
sized D2 and D3. There is moderate calcified plaque in the ostial
and proximal LAD with associated stenosis of 50-69%.

LCX is a non-dominant artery that gives rise to one large OM1
branch. There is no plaque.

Other findings:

Normal pulmonary vein drainage into the left atrium.

Normal let atrial appendage without a thrombus.

Normal size of the pulmonary artery.
IMPRESSION: 1. Coronary calcium score of 321. This was 91st percentile for age
and sex matched control.

2.  Normal coronary origin with right dominance.

3.  Moderate atherosclerosis of the LAD.  CAD-RADS 3.

4. Consider symptom guided anti-ischemic and preventive
pharmacotherapy as well as risk factor modification per
guideline-directed care.

5.  This study has been submitted for FFR analysis.

6.  Aortic atherosclerosis.

Alexs Badia

*** End of Addendum ***
EXAM:
OVER-READ INTERPRETATION  CT CHEST

The following report is an over-read performed by radiologist Dr.
Dyven Mulet [REDACTED] on 08/28/2019. This
over-read does not include interpretation of cardiac or coronary
anatomy or pathology. The coronary calcium score/coronary CTA
interpretation by the cardiologist is attached.
FINDINGS: Vascular: Heart is enlarged.  No pericardial effusion.

Mediastinum/Nodes: No pathologically enlarged lymph nodes. Esophagus
is grossly unremarkable.

Lungs/Pleura: A subpleural lymph node along the minor fissure
([DATE]), unchanged. 5 mm peripheral left lower lobe nodule ([DATE])
also unchanged and benign. Scarring in the lingula.

Upper Abdomen: None.

Musculoskeletal: Degenerative changes in the spine.
IMPRESSION: No acute extracardiac findings.

## 2021-10-11 DIAGNOSIS — I10 Essential (primary) hypertension: Secondary | ICD-10-CM | POA: Diagnosis not present

## 2021-10-11 DIAGNOSIS — F419 Anxiety disorder, unspecified: Secondary | ICD-10-CM | POA: Diagnosis not present

## 2021-10-11 DIAGNOSIS — K219 Gastro-esophageal reflux disease without esophagitis: Secondary | ICD-10-CM | POA: Diagnosis not present

## 2021-10-11 DIAGNOSIS — E669 Obesity, unspecified: Secondary | ICD-10-CM | POA: Diagnosis not present

## 2021-10-11 DIAGNOSIS — Z6835 Body mass index (BMI) 35.0-35.9, adult: Secondary | ICD-10-CM | POA: Diagnosis not present

## 2021-10-19 ENCOUNTER — Telehealth: Payer: Self-pay | Admitting: Cardiology

## 2021-10-19 NOTE — Telephone Encounter (Signed)
Spoke with pt who states that her heart rate is low most of the time. BP/HR are below:  10/15/21 139/75 HR 60  137/80 HR 46 10/16/21 139/73 HR 47  138/66 HR 48 10/17/21 135/70 HR 36  114/77 HR 79 10/18/21 125/66 HR 37  126/83 HR 81 10/19/21 128/74 HR 60  137/72 HR 52  Pt reports feeling lightheaded and dizzy. Advised to take Metoprolol 25 mg half tablet in the am and half in the pm. Advised to keep a keep a BP log including heart rate 1-2 hours after medication and bring with her to her appointment. Pt verbalized understanding and had no additional questions.

## 2021-10-19 NOTE — Telephone Encounter (Signed)
STAT if HR is under 50 or over 120 (normal HR is 60-100 beats per minute)  What is your heart rate?  5/30: 12/83 81 (10:30 AM)   Do you have a log of your heart rate readings (document readings)?  No log available, but patient states her HR has been in the low 40's-90's all weekend   Do you have any other symptoms?

## 2021-10-27 ENCOUNTER — Other Ambulatory Visit: Payer: Self-pay

## 2021-10-28 ENCOUNTER — Encounter: Payer: Self-pay | Admitting: Cardiology

## 2021-10-28 ENCOUNTER — Ambulatory Visit (INDEPENDENT_AMBULATORY_CARE_PROVIDER_SITE_OTHER): Payer: Medicare HMO | Admitting: Cardiology

## 2021-10-28 VITALS — BP 144/84 | HR 88 | Ht 61.0 in | Wt 203.6 lb

## 2021-10-28 DIAGNOSIS — I2510 Atherosclerotic heart disease of native coronary artery without angina pectoris: Secondary | ICD-10-CM | POA: Diagnosis not present

## 2021-10-28 DIAGNOSIS — E669 Obesity, unspecified: Secondary | ICD-10-CM

## 2021-10-28 DIAGNOSIS — E782 Mixed hyperlipidemia: Secondary | ICD-10-CM | POA: Diagnosis not present

## 2021-10-28 NOTE — Patient Instructions (Addendum)
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
## 2021-11-10 DIAGNOSIS — E669 Obesity, unspecified: Secondary | ICD-10-CM | POA: Diagnosis not present

## 2021-11-10 DIAGNOSIS — N959 Unspecified menopausal and perimenopausal disorder: Secondary | ICD-10-CM | POA: Diagnosis not present

## 2021-11-10 DIAGNOSIS — F419 Anxiety disorder, unspecified: Secondary | ICD-10-CM | POA: Diagnosis not present

## 2021-11-10 DIAGNOSIS — J309 Allergic rhinitis, unspecified: Secondary | ICD-10-CM | POA: Diagnosis not present

## 2021-11-10 DIAGNOSIS — L988 Other specified disorders of the skin and subcutaneous tissue: Secondary | ICD-10-CM | POA: Diagnosis not present

## 2021-12-10 DIAGNOSIS — K219 Gastro-esophageal reflux disease without esophagitis: Secondary | ICD-10-CM | POA: Diagnosis not present

## 2021-12-10 DIAGNOSIS — I10 Essential (primary) hypertension: Secondary | ICD-10-CM | POA: Diagnosis not present

## 2021-12-10 DIAGNOSIS — Z6835 Body mass index (BMI) 35.0-35.9, adult: Secondary | ICD-10-CM | POA: Diagnosis not present

## 2021-12-10 DIAGNOSIS — F419 Anxiety disorder, unspecified: Secondary | ICD-10-CM | POA: Diagnosis not present

## 2021-12-13 DIAGNOSIS — B372 Candidiasis of skin and nail: Secondary | ICD-10-CM | POA: Diagnosis not present

## 2021-12-24 ENCOUNTER — Telehealth: Payer: Self-pay | Admitting: Cardiology

## 2021-12-24 NOTE — Telephone Encounter (Signed)
Pt c/o medication issue:  1. Name of Medication: Metoprolol 2. How are you currently taking this medication (dosage and times per day)?   3. Are you having a reaction (difficulty breathing--STAT)?   4. What is your medication issue? Feel like somebody is squeezing her heart all the time. She wonder if he call something else in today to replace it

## 2021-12-30 DIAGNOSIS — I10 Essential (primary) hypertension: Secondary | ICD-10-CM | POA: Diagnosis not present

## 2021-12-30 DIAGNOSIS — J449 Chronic obstructive pulmonary disease, unspecified: Secondary | ICD-10-CM | POA: Diagnosis not present

## 2021-12-30 DIAGNOSIS — E559 Vitamin D deficiency, unspecified: Secondary | ICD-10-CM | POA: Diagnosis not present

## 2021-12-30 DIAGNOSIS — Z6837 Body mass index (BMI) 37.0-37.9, adult: Secondary | ICD-10-CM | POA: Diagnosis not present

## 2022-01-03 NOTE — Telephone Encounter (Signed)
Left vm for pt to callback 

## 2022-01-05 NOTE — Telephone Encounter (Signed)
Pt states that she was seen by her PCP and is doing better.

## 2022-01-10 DIAGNOSIS — K219 Gastro-esophageal reflux disease without esophagitis: Secondary | ICD-10-CM | POA: Diagnosis not present

## 2022-01-10 DIAGNOSIS — F419 Anxiety disorder, unspecified: Secondary | ICD-10-CM | POA: Diagnosis not present

## 2022-01-10 DIAGNOSIS — Z6837 Body mass index (BMI) 37.0-37.9, adult: Secondary | ICD-10-CM | POA: Diagnosis not present

## 2022-01-13 DIAGNOSIS — N959 Unspecified menopausal and perimenopausal disorder: Secondary | ICD-10-CM | POA: Diagnosis not present

## 2022-01-13 DIAGNOSIS — M8589 Other specified disorders of bone density and structure, multiple sites: Secondary | ICD-10-CM | POA: Diagnosis not present

## 2022-02-09 DIAGNOSIS — F419 Anxiety disorder, unspecified: Secondary | ICD-10-CM | POA: Diagnosis not present

## 2022-02-09 DIAGNOSIS — Z8679 Personal history of other diseases of the circulatory system: Secondary | ICD-10-CM | POA: Diagnosis not present

## 2022-02-09 DIAGNOSIS — F330 Major depressive disorder, recurrent, mild: Secondary | ICD-10-CM | POA: Diagnosis not present

## 2022-02-09 DIAGNOSIS — Z8709 Personal history of other diseases of the respiratory system: Secondary | ICD-10-CM | POA: Diagnosis not present

## 2022-02-10 DIAGNOSIS — Z79899 Other long term (current) drug therapy: Secondary | ICD-10-CM | POA: Diagnosis not present

## 2022-02-10 DIAGNOSIS — E559 Vitamin D deficiency, unspecified: Secondary | ICD-10-CM | POA: Diagnosis not present

## 2022-02-10 DIAGNOSIS — Z23 Encounter for immunization: Secondary | ICD-10-CM | POA: Diagnosis not present

## 2022-03-02 DIAGNOSIS — M7989 Other specified soft tissue disorders: Secondary | ICD-10-CM | POA: Diagnosis not present

## 2022-03-02 DIAGNOSIS — M79606 Pain in leg, unspecified: Secondary | ICD-10-CM | POA: Diagnosis not present

## 2022-03-02 DIAGNOSIS — Z6835 Body mass index (BMI) 35.0-35.9, adult: Secondary | ICD-10-CM | POA: Diagnosis not present

## 2022-03-02 DIAGNOSIS — R42 Dizziness and giddiness: Secondary | ICD-10-CM | POA: Diagnosis not present

## 2022-03-11 DIAGNOSIS — J309 Allergic rhinitis, unspecified: Secondary | ICD-10-CM | POA: Diagnosis not present

## 2022-03-11 DIAGNOSIS — Z6835 Body mass index (BMI) 35.0-35.9, adult: Secondary | ICD-10-CM | POA: Diagnosis not present

## 2022-03-11 DIAGNOSIS — F419 Anxiety disorder, unspecified: Secondary | ICD-10-CM | POA: Diagnosis not present

## 2022-03-11 DIAGNOSIS — R600 Localized edema: Secondary | ICD-10-CM | POA: Diagnosis not present

## 2022-03-15 DIAGNOSIS — M1711 Unilateral primary osteoarthritis, right knee: Secondary | ICD-10-CM | POA: Diagnosis not present

## 2022-03-15 DIAGNOSIS — R42 Dizziness and giddiness: Secondary | ICD-10-CM | POA: Diagnosis not present

## 2022-03-15 DIAGNOSIS — S8991XA Unspecified injury of right lower leg, initial encounter: Secondary | ICD-10-CM | POA: Diagnosis not present

## 2022-03-15 DIAGNOSIS — Z9181 History of falling: Secondary | ICD-10-CM | POA: Diagnosis not present

## 2022-04-11 DIAGNOSIS — Z6837 Body mass index (BMI) 37.0-37.9, adult: Secondary | ICD-10-CM | POA: Diagnosis not present

## 2022-04-11 DIAGNOSIS — I10 Essential (primary) hypertension: Secondary | ICD-10-CM | POA: Diagnosis not present

## 2022-04-11 DIAGNOSIS — F419 Anxiety disorder, unspecified: Secondary | ICD-10-CM | POA: Diagnosis not present

## 2022-04-11 DIAGNOSIS — K219 Gastro-esophageal reflux disease without esophagitis: Secondary | ICD-10-CM | POA: Diagnosis not present

## 2022-05-11 DIAGNOSIS — K219 Gastro-esophageal reflux disease without esophagitis: Secondary | ICD-10-CM | POA: Diagnosis not present

## 2022-05-11 DIAGNOSIS — F419 Anxiety disorder, unspecified: Secondary | ICD-10-CM | POA: Diagnosis not present

## 2022-05-11 DIAGNOSIS — F330 Major depressive disorder, recurrent, mild: Secondary | ICD-10-CM | POA: Diagnosis not present

## 2022-05-11 DIAGNOSIS — E785 Hyperlipidemia, unspecified: Secondary | ICD-10-CM | POA: Diagnosis not present

## 2022-05-11 DIAGNOSIS — Z8679 Personal history of other diseases of the circulatory system: Secondary | ICD-10-CM | POA: Diagnosis not present

## 2022-05-11 DIAGNOSIS — J309 Allergic rhinitis, unspecified: Secondary | ICD-10-CM | POA: Diagnosis not present

## 2022-06-10 DIAGNOSIS — Z6835 Body mass index (BMI) 35.0-35.9, adult: Secondary | ICD-10-CM | POA: Diagnosis not present

## 2022-06-10 DIAGNOSIS — J449 Chronic obstructive pulmonary disease, unspecified: Secondary | ICD-10-CM | POA: Diagnosis not present

## 2022-06-10 DIAGNOSIS — F419 Anxiety disorder, unspecified: Secondary | ICD-10-CM | POA: Diagnosis not present

## 2022-06-10 DIAGNOSIS — E669 Obesity, unspecified: Secondary | ICD-10-CM | POA: Diagnosis not present

## 2022-06-10 DIAGNOSIS — R600 Localized edema: Secondary | ICD-10-CM | POA: Diagnosis not present

## 2022-06-23 DIAGNOSIS — S8000XA Contusion of unspecified knee, initial encounter: Secondary | ICD-10-CM | POA: Diagnosis not present

## 2022-06-23 DIAGNOSIS — S8992XA Unspecified injury of left lower leg, initial encounter: Secondary | ICD-10-CM | POA: Diagnosis not present

## 2022-06-23 DIAGNOSIS — R519 Headache, unspecified: Secondary | ICD-10-CM | POA: Diagnosis not present

## 2022-06-23 DIAGNOSIS — R102 Pelvic and perineal pain: Secondary | ICD-10-CM | POA: Diagnosis not present

## 2022-06-23 DIAGNOSIS — S0990XA Unspecified injury of head, initial encounter: Secondary | ICD-10-CM | POA: Diagnosis not present

## 2022-06-23 DIAGNOSIS — M1712 Unilateral primary osteoarthritis, left knee: Secondary | ICD-10-CM | POA: Diagnosis not present

## 2022-06-23 DIAGNOSIS — S8001XA Contusion of right knee, initial encounter: Secondary | ICD-10-CM | POA: Diagnosis not present

## 2022-06-23 DIAGNOSIS — Z043 Encounter for examination and observation following other accident: Secondary | ICD-10-CM | POA: Diagnosis not present

## 2022-06-23 DIAGNOSIS — S8002XA Contusion of left knee, initial encounter: Secondary | ICD-10-CM | POA: Diagnosis not present

## 2022-06-23 DIAGNOSIS — S0993XA Unspecified injury of face, initial encounter: Secondary | ICD-10-CM | POA: Diagnosis not present

## 2022-06-23 DIAGNOSIS — W010XXA Fall on same level from slipping, tripping and stumbling without subsequent striking against object, initial encounter: Secondary | ICD-10-CM | POA: Diagnosis not present

## 2022-06-23 DIAGNOSIS — S0081XA Abrasion of other part of head, initial encounter: Secondary | ICD-10-CM | POA: Diagnosis not present

## 2022-06-23 DIAGNOSIS — M25561 Pain in right knee: Secondary | ICD-10-CM | POA: Diagnosis not present

## 2022-06-28 DIAGNOSIS — Z09 Encounter for follow-up examination after completed treatment for conditions other than malignant neoplasm: Secondary | ICD-10-CM | POA: Diagnosis not present

## 2022-06-28 DIAGNOSIS — M7989 Other specified soft tissue disorders: Secondary | ICD-10-CM | POA: Diagnosis not present

## 2022-06-28 DIAGNOSIS — N952 Postmenopausal atrophic vaginitis: Secondary | ICD-10-CM | POA: Diagnosis not present

## 2022-06-28 DIAGNOSIS — Z6839 Body mass index (BMI) 39.0-39.9, adult: Secondary | ICD-10-CM | POA: Diagnosis not present

## 2022-06-28 DIAGNOSIS — Z79899 Other long term (current) drug therapy: Secondary | ICD-10-CM | POA: Diagnosis not present

## 2022-06-28 DIAGNOSIS — N343 Urethral syndrome, unspecified: Secondary | ICD-10-CM | POA: Diagnosis not present

## 2022-07-05 DIAGNOSIS — R600 Localized edema: Secondary | ICD-10-CM | POA: Diagnosis not present

## 2022-07-05 DIAGNOSIS — R9431 Abnormal electrocardiogram [ECG] [EKG]: Secondary | ICD-10-CM | POA: Diagnosis not present

## 2022-07-05 DIAGNOSIS — R0602 Shortness of breath: Secondary | ICD-10-CM | POA: Diagnosis not present

## 2022-07-05 DIAGNOSIS — Z87891 Personal history of nicotine dependence: Secondary | ICD-10-CM | POA: Diagnosis not present

## 2022-07-05 DIAGNOSIS — I4891 Unspecified atrial fibrillation: Secondary | ICD-10-CM | POA: Diagnosis not present

## 2022-07-05 DIAGNOSIS — M7989 Other specified soft tissue disorders: Secondary | ICD-10-CM | POA: Diagnosis not present

## 2022-07-05 DIAGNOSIS — I10 Essential (primary) hypertension: Secondary | ICD-10-CM | POA: Diagnosis not present

## 2022-07-05 DIAGNOSIS — K219 Gastro-esophageal reflux disease without esophagitis: Secondary | ICD-10-CM | POA: Diagnosis not present

## 2022-07-05 DIAGNOSIS — Z8673 Personal history of transient ischemic attack (TIA), and cerebral infarction without residual deficits: Secondary | ICD-10-CM | POA: Diagnosis not present

## 2022-07-07 ENCOUNTER — Telehealth: Payer: Self-pay | Admitting: Cardiology

## 2022-07-07 DIAGNOSIS — R609 Edema, unspecified: Secondary | ICD-10-CM

## 2022-07-07 NOTE — Telephone Encounter (Signed)
Pt calling back stating she is still in pain and worried on what to do because she hasn't heard anything back.

## 2022-07-07 NOTE — Telephone Encounter (Signed)
Pt c/o swelling: STAT is pt has developed SOB within 24 hours  How much weight have you gained and in what time span?  Patient states she has not been weighing herself  If swelling, where is the swelling located?  Feet and legs   Are you currently taking a fluid pill?  Yes and patient mentions wearing compression stockings yesterday from 4:00-9:00 PM but having to remove them due to being in so much pain   Are you currently SOB?  No   Do you have a log of your daily weights (if so, list)?  No  Have you gained 3 pounds in a day or 5 pounds in a week?  Unsure   Have you traveled recently?  No

## 2022-07-08 NOTE — Telephone Encounter (Signed)
Spoke with pt and advised per Dr. Julien Nordmann note. Pt has furosemide from Dr. Anders Grant. Advised to take Lasix 40 mg daily once daily for 3 days and then let her come for a nurse visit on Monday and lets do a pulse, blood pressure check and Chem-7.  She needs to keep a log call for weights on a daily basis. Pt wrote this down, agreed and verbalized understanding.

## 2022-07-11 ENCOUNTER — Ambulatory Visit: Payer: Medicare HMO | Attending: Cardiology

## 2022-07-11 ENCOUNTER — Other Ambulatory Visit: Payer: Self-pay

## 2022-07-11 VITALS — BP 120/72 | HR 80

## 2022-07-11 DIAGNOSIS — R609 Edema, unspecified: Secondary | ICD-10-CM | POA: Diagnosis not present

## 2022-07-11 MED ORDER — FUROSEMIDE 40 MG PO TABS: 40.0000 mg | ORAL_TABLET | Freq: Two times a day (BID) | ORAL | 3 refills | Status: DC

## 2022-07-11 NOTE — Progress Notes (Signed)
   Nurse Visit   Date of Encounter: 07/11/2022 ID: Diane Lawrence, DOB November 27, 1950, MRN [id number]  PCP:  Nicholos Johns, [place] [facility] Cardiologist:  None      Visit Details   VS:  BP 120/72 (BP Location: Left Arm, Patient Position: Sitting, Cuff Size: Normal)   Pulse 80  , BMI There is no height or weight on file to calculate BMI.  Wt Readings from Last 3 Encounters:  10/28/21 203 lb 9.6 oz (92.4 kg)  04/29/21 187 lb 12.8 oz (85.2 kg)  07/28/20 190 lb 12.8 oz (86.5 kg)     Reason for visit: Perform Vital signs and have lab draw Performed today: Vitals, Provider consulted, Education Changes (medications, testing, etc.) : Orders for Pro BNP, BMP, and an echo were entered into Epic Length of Visit: 35 minutes    Medications Adjustments/Labs and Tests Ordered: Orders Placed This Encounter  Procedures   Basic Metabolic Panel (BMET)   Pro b natriuretic peptide   ECHOCARDIOGRAM COMPLETE   Meds ordered this encounter  Medications   furosemide (LASIX) 40 MG tablet    Sig: Take 1 tablet (40 mg total) by mouth 2 (two) times daily.    Dispense:  180 tablet    Refill:  3     Signed, Louie Casa, RN  07/11/2022 1:41 PM

## 2022-07-12 LAB — BASIC METABOLIC PANEL
BUN/Creatinine Ratio: 13 (ref 12–28)
BUN: 9 mg/dL (ref 8–27)
CO2: 23 mmol/L (ref 20–29)
Calcium: 9.5 mg/dL (ref 8.7–10.3)
Chloride: 103 mmol/L (ref 96–106)
Creatinine, Ser: 0.72 mg/dL (ref 0.57–1.00)
Glucose: 96 mg/dL (ref 70–99)
Potassium: 4.5 mmol/L (ref 3.5–5.2)
Sodium: 141 mmol/L (ref 134–144)
eGFR: 90 mL/min/{1.73_m2} (ref >59)

## 2022-07-12 LAB — PRO B NATRIURETIC PEPTIDE: NT-Pro BNP: 102 pg/mL (ref 0–301)

## 2022-07-13 DIAGNOSIS — K7581 Nonalcoholic steatohepatitis (NASH): Secondary | ICD-10-CM | POA: Diagnosis not present

## 2022-07-13 DIAGNOSIS — I10 Essential (primary) hypertension: Secondary | ICD-10-CM | POA: Diagnosis not present

## 2022-07-13 DIAGNOSIS — Z09 Encounter for follow-up examination after completed treatment for conditions other than malignant neoplasm: Secondary | ICD-10-CM | POA: Diagnosis not present

## 2022-07-13 DIAGNOSIS — F419 Anxiety disorder, unspecified: Secondary | ICD-10-CM | POA: Diagnosis not present

## 2022-07-13 DIAGNOSIS — Z6839 Body mass index (BMI) 39.0-39.9, adult: Secondary | ICD-10-CM | POA: Diagnosis not present

## 2022-07-13 DIAGNOSIS — K219 Gastro-esophageal reflux disease without esophagitis: Secondary | ICD-10-CM | POA: Diagnosis not present

## 2022-07-15 ENCOUNTER — Telehealth: Payer: Self-pay | Admitting: Cardiology

## 2022-07-15 ENCOUNTER — Other Ambulatory Visit: Payer: Self-pay

## 2022-07-15 NOTE — Telephone Encounter (Signed)
Patient calling to see if she is suppose to be take potassium. Please advise

## 2022-07-15 NOTE — Telephone Encounter (Signed)
Called patient and she reported that Dr. Anders Grant had started her on Potasium. She wanted to know if since Dr. Agustin Cree, this past Monday,  had increased her Lasix if he wanted to increase her Potasium as well. I explained that since Dr. Anders Grant had started her on the Potasium she should reach out to her to see if she wants to increase it. Patient was agreeable with this plan and had no further questions at this time.

## 2022-07-15 NOTE — Telephone Encounter (Signed)
Patient would like to start seeing Dr, Agustin Cree. Please advise

## 2022-07-20 ENCOUNTER — Telehealth: Payer: Self-pay

## 2022-07-20 NOTE — Telephone Encounter (Signed)
LVM per DPR- per Dr. Wendy Poet note regarding normal results. Encouraged to call with any questions. Routed to PCP.

## 2022-07-21 ENCOUNTER — Ambulatory Visit: Payer: Medicare HMO | Attending: Cardiology

## 2022-07-21 DIAGNOSIS — R601 Generalized edema: Secondary | ICD-10-CM | POA: Diagnosis not present

## 2022-07-21 DIAGNOSIS — R609 Edema, unspecified: Secondary | ICD-10-CM

## 2022-07-21 LAB — ECHOCARDIOGRAM COMPLETE
Area-P 1/2: 4.06 cm2
S' Lateral: 3.2 cm

## 2022-08-09 DIAGNOSIS — J309 Allergic rhinitis, unspecified: Secondary | ICD-10-CM | POA: Diagnosis not present

## 2022-08-09 DIAGNOSIS — Z6839 Body mass index (BMI) 39.0-39.9, adult: Secondary | ICD-10-CM | POA: Diagnosis not present

## 2022-08-09 DIAGNOSIS — H68003 Unspecified Eustachian salpingitis, bilateral: Secondary | ICD-10-CM | POA: Diagnosis not present

## 2022-08-09 DIAGNOSIS — F419 Anxiety disorder, unspecified: Secondary | ICD-10-CM | POA: Diagnosis not present

## 2022-08-09 DIAGNOSIS — R600 Localized edema: Secondary | ICD-10-CM | POA: Diagnosis not present

## 2022-08-18 DIAGNOSIS — Z6839 Body mass index (BMI) 39.0-39.9, adult: Secondary | ICD-10-CM | POA: Diagnosis not present

## 2022-08-18 DIAGNOSIS — R600 Localized edema: Secondary | ICD-10-CM | POA: Diagnosis not present

## 2022-09-07 DIAGNOSIS — M159 Polyosteoarthritis, unspecified: Secondary | ICD-10-CM | POA: Diagnosis not present

## 2022-09-07 DIAGNOSIS — K219 Gastro-esophageal reflux disease without esophagitis: Secondary | ICD-10-CM | POA: Diagnosis not present

## 2022-09-07 DIAGNOSIS — I2510 Atherosclerotic heart disease of native coronary artery without angina pectoris: Secondary | ICD-10-CM | POA: Diagnosis not present

## 2022-09-07 DIAGNOSIS — F419 Anxiety disorder, unspecified: Secondary | ICD-10-CM | POA: Diagnosis not present

## 2022-09-07 DIAGNOSIS — Z6839 Body mass index (BMI) 39.0-39.9, adult: Secondary | ICD-10-CM | POA: Diagnosis not present

## 2022-09-29 DIAGNOSIS — R079 Chest pain, unspecified: Secondary | ICD-10-CM | POA: Diagnosis not present

## 2022-09-29 DIAGNOSIS — R9431 Abnormal electrocardiogram [ECG] [EKG]: Secondary | ICD-10-CM | POA: Diagnosis not present

## 2022-09-29 DIAGNOSIS — I10 Essential (primary) hypertension: Secondary | ICD-10-CM | POA: Diagnosis not present

## 2022-09-29 DIAGNOSIS — I4891 Unspecified atrial fibrillation: Secondary | ICD-10-CM | POA: Diagnosis not present

## 2022-09-29 DIAGNOSIS — K219 Gastro-esophageal reflux disease without esophagitis: Secondary | ICD-10-CM | POA: Diagnosis not present

## 2022-10-04 DIAGNOSIS — E669 Obesity, unspecified: Secondary | ICD-10-CM | POA: Diagnosis not present

## 2022-10-04 DIAGNOSIS — K219 Gastro-esophageal reflux disease without esophagitis: Secondary | ICD-10-CM | POA: Diagnosis not present

## 2022-10-04 DIAGNOSIS — J449 Chronic obstructive pulmonary disease, unspecified: Secondary | ICD-10-CM | POA: Diagnosis not present

## 2022-10-04 DIAGNOSIS — Z09 Encounter for follow-up examination after completed treatment for conditions other than malignant neoplasm: Secondary | ICD-10-CM | POA: Diagnosis not present

## 2022-10-04 DIAGNOSIS — K5792 Diverticulitis of intestine, part unspecified, without perforation or abscess without bleeding: Secondary | ICD-10-CM | POA: Diagnosis not present

## 2022-10-04 DIAGNOSIS — Z6837 Body mass index (BMI) 37.0-37.9, adult: Secondary | ICD-10-CM | POA: Diagnosis not present

## 2022-10-04 DIAGNOSIS — F419 Anxiety disorder, unspecified: Secondary | ICD-10-CM | POA: Diagnosis not present

## 2022-10-07 ENCOUNTER — Telehealth: Payer: Self-pay

## 2022-10-07 NOTE — Telephone Encounter (Signed)
Transition Care Management Follow-up Telephone Call Date of discharge and from where: 09/29/2022 [facility] How have you been since you were released from the hospital? Patient stated she is not feeling any better. Any questions or concerns? No  Items Reviewed: Did the pt receive and understand the discharge instructions provided? Yes  Medications obtained and verified? Yes  Other?  Patient stated she was not given antibiotics or pain medication and no xrays were performed. Any new allergies since your discharge? No  Dietary orders reviewed? Yes Do you have support at home? Yes   Follow up appointments reviewed:  PCP Hospital f/u appt confirmed?  Patient stated she has seen her PCP.  Scheduled to see  on  @ . Specialist Hospital f/u appt confirmed? No  Scheduled to see  on  @ . Are transportation arrangements needed? No  If their condition worsens, is the pt aware to call PCP or go to the Emergency Dept.? Yes Was the patient provided with contact information for the PCP's office or ED? Yes Was to pt encouraged to call back with questions or concerns? Yes  Patient asked that I send her community resources for behavioral health services. Letter saved in Epic.  Omarii Scalzo [facility]  [facility] Community Resource Care Guide   [email or web address]  ?? [phone]   Website: [email or web address]  [email or web address]

## 2022-10-21 DIAGNOSIS — K219 Gastro-esophageal reflux disease without esophagitis: Secondary | ICD-10-CM | POA: Diagnosis not present

## 2022-10-21 DIAGNOSIS — K5792 Diverticulitis of intestine, part unspecified, without perforation or abscess without bleeding: Secondary | ICD-10-CM | POA: Diagnosis not present

## 2022-10-25 DIAGNOSIS — K2270 Barrett's esophagus without dysplasia: Secondary | ICD-10-CM | POA: Insufficient documentation

## 2022-10-25 DIAGNOSIS — D131 Benign neoplasm of stomach: Secondary | ICD-10-CM

## 2022-10-25 HISTORY — DX: Barrett's esophagus without dysplasia: K22.70

## 2022-10-25 HISTORY — DX: Benign neoplasm of stomach: D13.1

## 2022-10-26 DIAGNOSIS — Z86010 Personal history of colonic polyps: Secondary | ICD-10-CM | POA: Diagnosis not present

## 2022-10-26 DIAGNOSIS — R1084 Generalized abdominal pain: Secondary | ICD-10-CM | POA: Diagnosis not present

## 2022-10-26 DIAGNOSIS — Z800 Family history of malignant neoplasm of digestive organs: Secondary | ICD-10-CM | POA: Diagnosis not present

## 2022-10-26 DIAGNOSIS — K219 Gastro-esophageal reflux disease without esophagitis: Secondary | ICD-10-CM | POA: Diagnosis not present

## 2022-10-26 DIAGNOSIS — D131 Benign neoplasm of stomach: Secondary | ICD-10-CM | POA: Diagnosis not present

## 2022-10-26 DIAGNOSIS — K2270 Barrett's esophagus without dysplasia: Secondary | ICD-10-CM | POA: Diagnosis not present

## 2022-11-04 DIAGNOSIS — Z6839 Body mass index (BMI) 39.0-39.9, adult: Secondary | ICD-10-CM | POA: Diagnosis not present

## 2022-11-04 DIAGNOSIS — R600 Localized edema: Secondary | ICD-10-CM | POA: Diagnosis not present

## 2022-11-04 DIAGNOSIS — J309 Allergic rhinitis, unspecified: Secondary | ICD-10-CM | POA: Diagnosis not present

## 2022-11-04 DIAGNOSIS — F419 Anxiety disorder, unspecified: Secondary | ICD-10-CM | POA: Diagnosis not present

## 2022-11-16 DIAGNOSIS — K5792 Diverticulitis of intestine, part unspecified, without perforation or abscess without bleeding: Secondary | ICD-10-CM | POA: Diagnosis not present

## 2022-11-16 DIAGNOSIS — Z6836 Body mass index (BMI) 36.0-36.9, adult: Secondary | ICD-10-CM | POA: Diagnosis not present

## 2022-11-30 DIAGNOSIS — I700 Atherosclerosis of aorta: Secondary | ICD-10-CM | POA: Diagnosis not present

## 2022-11-30 DIAGNOSIS — E559 Vitamin D deficiency, unspecified: Secondary | ICD-10-CM | POA: Diagnosis not present

## 2022-11-30 DIAGNOSIS — I10 Essential (primary) hypertension: Secondary | ICD-10-CM | POA: Diagnosis not present

## 2022-11-30 DIAGNOSIS — I2510 Atherosclerotic heart disease of native coronary artery without angina pectoris: Secondary | ICD-10-CM | POA: Diagnosis not present

## 2022-11-30 DIAGNOSIS — M47812 Spondylosis without myelopathy or radiculopathy, cervical region: Secondary | ICD-10-CM | POA: Diagnosis not present

## 2022-11-30 DIAGNOSIS — R600 Localized edema: Secondary | ICD-10-CM | POA: Diagnosis not present

## 2022-11-30 DIAGNOSIS — Z6836 Body mass index (BMI) 36.0-36.9, adult: Secondary | ICD-10-CM | POA: Diagnosis not present

## 2022-11-30 DIAGNOSIS — F419 Anxiety disorder, unspecified: Secondary | ICD-10-CM | POA: Diagnosis not present

## 2023-01-02 DIAGNOSIS — M159 Polyosteoarthritis, unspecified: Secondary | ICD-10-CM | POA: Diagnosis not present

## 2023-01-02 DIAGNOSIS — K219 Gastro-esophageal reflux disease without esophagitis: Secondary | ICD-10-CM | POA: Diagnosis not present

## 2023-01-02 DIAGNOSIS — M7989 Other specified soft tissue disorders: Secondary | ICD-10-CM | POA: Diagnosis not present

## 2023-01-02 DIAGNOSIS — F419 Anxiety disorder, unspecified: Secondary | ICD-10-CM | POA: Diagnosis not present

## 2023-01-06 ENCOUNTER — Other Ambulatory Visit: Payer: Self-pay

## 2023-01-08 NOTE — Progress Notes (Signed)
Cardiology Office Note:  .   Date:  01/09/2023  ID:  [name], DOB 1950/12/31, MRN [id number] PCP: Lucianne Lei, MD  [facility] Providers Cardiologist:  Gypsy Balsam, MD    History of Present Illness: Diane Lawrence is a 72 y.o. female with a past medical history of CAD, GERD, IBS, dyslipidemia.  07/21/2022 echo EF 66 5%, grade 1 DD 08/29/2019 coronary CTA calcium score 321, 91st percentile, moderate atherosclerosis of the LAD, FFR was negative for hemodynamic significance 08/12/2019 monitor average heart rate 70 bpm, predominant underlying rhythm was sinus, 2 episodes of SVT, no atrial fibrillation, no pauses, 59 triggered events associated with PVCs and PACs  Last evaluated by Dr. Tomie China on 10/28/2021, was stable from a cardiac perspective and advised to follow-up in9 months.    She presents today for follow-up of her coronary artery disease, also has upcoming EGD and colonoscopy.  She is most concerned about swelling on her left ear, states this been going on for approximately a year she was told at some point this could be a peritonsillar abscess. She feels strongly that the swelling she is experiencing is related to plaque. She denies chest pain, palpitations, dyspnea, pnd, orthopnea, n, v, dizziness, syncope, edema, weight gain, or early satiety.    ROS: Review of Systems  Constitutional: Negative.   HENT: Negative.    Eyes: Negative.   Respiratory: Negative.    Cardiovascular: Negative.   Gastrointestinal: Negative.   Genitourinary: Negative.   Musculoskeletal: Negative.   Skin: Negative.   Neurological: Negative.   Endo/Heme/Allergies: Negative.   Psychiatric/Behavioral:  The patient is nervous/anxious.     Studies Reviewed: .       Cardiac Studies & Procedures     STRESS TESTS  MYOCARDIAL PERFUSION IMAGING 04/05/2021   ECHOCARDIOGRAM  ECHOCARDIOGRAM COMPLETE 07/21/2022  Narrative ECHOCARDIOGRAM REPORT    Patient Name:   Diane Lawrence Date of Exam: 07/21/2022 Medical Rec #:  [id number]         Height:       61.0 in Accession #:    [id number]        Weight:       203.6 lb Date of Birth:  July 03, 1950        BSA:          1.903 m Patient Age:    71 years          BP:           120/72 mmHg Patient Gender: F                 HR:           74 bpm. Exam Location:  [facility]  Procedure: 2D Echo, Cardiac Doppler and Color Doppler  Indications:    Edema, unspecified type [R60.9 (ICD-10-CM)]  History:        Patient has prior history of Echocardiogram examinations, most recent 05/10/2019. CAD; Risk Factors:Dyslipidemia and Hypertension.  Sonographer:    Margreta Journey RDCS Referring Phys: [id number] Georgeanna Lea   Sonographer Comments: Technically difficult study due to poor echo windows. Global longitudinal strain was attempted. PT. refused IV. IMPRESSIONS   1. Left ventricular ejection fraction, by estimation, is 60 to 65%. The left ventricle has normal function. The left ventricle has no regional wall motion abnormalities. Left ventricular diastolic parameters are consistent with Grade I diastolic dysfunction (impaired relaxation). 2. Right ventricular systolic function is normal. The right ventricular size is normal.  3. The mitral valve is normal in structure. No evidence of mitral valve regurgitation. No evidence of mitral stenosis. 4. The aortic valve is tricuspid. Aortic valve regurgitation is not visualized. No aortic stenosis is present. 5. Aortic Normal DTA. 6. The inferior vena cava is dilated in size with >50% respiratory variability, suggesting right atrial pressure of 8 mmHg.  FINDINGS Left Ventricle: Left ventricular ejection fraction, by estimation, is 60 to 65%. The left ventricle has normal function. The left ventricle has no regional wall motion abnormalities. The left ventricular internal cavity size was normal in size. There is no left ventricular hypertrophy. Left ventricular diastolic  parameters are consistent with Grade I diastolic dysfunction (impaired relaxation). Normal left ventricular filling pressure.  Right Ventricle: The right ventricular size is normal. No increase in right ventricular wall thickness. Right ventricular systolic function is normal.  Left Atrium: Left atrial size was normal in size.  Right Atrium: Right atrial size was normal in size.  Pericardium: There is no evidence of pericardial effusion. Presence of a prominent epicardial fat layer.  Mitral Valve: The mitral valve is normal in structure. No evidence of mitral valve regurgitation. No evidence of mitral valve stenosis.  Tricuspid Valve: The tricuspid valve is normal in structure. Tricuspid valve regurgitation is not demonstrated. No evidence of tricuspid stenosis.  Aortic Valve: The aortic valve is tricuspid. Aortic valve regurgitation is not visualized. No aortic stenosis is present.  Pulmonic Valve: The pulmonic valve was normal in structure. Pulmonic valve regurgitation is mild. No evidence of pulmonic stenosis.  Aorta: Normal DTA, the aortic root and ascending aorta are structurally normal, with no evidence of dilitation and the aortic arch was not well visualized.  Venous: The pulmonary veins were not well visualized. The inferior vena cava is dilated in size with greater than 50% respiratory variability, suggesting right atrial pressure of 8 mmHg.  IAS/Shunts: No atrial level shunt detected by color flow Doppler.   LEFT VENTRICLE PLAX 2D LVIDd:         5.00 cm   Diastology LVIDs:         3.20 cm   LV e' medial:    9.95 cm/s LV PW:         1.00 cm   LV E/e' medial:  9.3 LV IVS:        0.80 cm   LV e' lateral:   8.55 cm/s LVOT diam:     1.80 cm   LV E/e' lateral: 10.9 LV SV:         52 LV SV Index:   27 LVOT Area:     2.54 cm   IVC IVC diam: 2.40 cm  LEFT ATRIUM           Index LA diam:      3.40 cm 1.79 cm/m LA Vol (A4C): 18.1 ml 9.51 ml/m AORTIC VALVE              PULMONIC VALVE LVOT Vmax:   99.20 cm/s  PR End Diast Vel: 3.56 msec LVOT Vmean:  68.500 cm/s LVOT VTI:    0.205 m  AORTA Ao Root diam: 3.40 cm Ao Asc diam:  3.00 cm Ao Desc diam: 2.10 cm  MITRAL VALVE MV Area (PHT): 4.06 cm    SHUNTS MV Decel Time: 187 msec    Systemic VTI:  0.20 m MV E velocity: 92.80 cm/s  Systemic Diam: 1.80 cm MV A velocity: 92.20 cm/s MV E/A ratio:  1.01  Norman Herrlich MD Electronically signed  by Norman Herrlich MD Signature Date/Time: 07/21/2022/5:38:28 PM    Final    MONITORS  LONG TERM MONITOR (3-14 DAYS) 08/09/2019  Narrative The patient wore the monitor for 11 days 6 hours starting 07/19/2019. Indication: Palpitations The minimum heart rate was 45 bpm, maximum heart rate was 145 bpm, and average heart rate was  70 bpm. Predominant underlying rhythm was Sinus Rhythm.  2 Supraventricular Tachycardia runs occurred, the run with the fastest interval lasting 12.8 secs with a max rate of 133 bpm (avg 104 bpm); the run with the fastest interval was also the longest.  Premature atrial complexes were (<1.0%). Premature Ventricular complexes were rare (<1.0%). Ventricular Bigeminy and Trigeminy were present.  No Ventricular tachycardia, No pauses, No AV block and no atrial fibrillation present. 59 patient triggered events noted 15 associated premature ventricular complexes  and 10 associated with premature atrial complexes.   Conclusion: This study is remarkable symptomatic premature ventricular complexes, symptomatic premature atrial complexes and asymptomatic supraventricular tachycardia which is likely paroxysmal atrial tachycardia with variable block.   CT SCANS  CT CORONARY MORPH W/CTA COR W/SCORE 08/28/2019  Addendum 08/28/2019  9:21 PM ADDENDUM REPORT: 08/28/2019 21:18  EXAM: Cardiac/Coronary  CT  TECHNIQUE: The patient was scanned on a [employer].  FINDINGS: A 120 kV prospective scan was triggered in the descending  thoracic aorta at 111 HU's. Axial non-contrast 3 mm slices were carried out through the heart. The data set was analyzed on a dedicated work station and scored using the Agatson method. Gantry rotation speed was 250 msecs and collimation was .6 mm. No beta blockade and 0.8 mg of sl NTG was given. The 3D data set was reconstructed in 5% intervals of the 67-82 % of the R-R cycle. Diastolic phases were analyzed on a dedicated work station using MPR, MIP and VRT modes. The patient received 80 cc of contrast.  Aorta:  Normal size.  Scattered calcifications.  No dissection.  Aortic Valve:  Trileaflet.  No calcifications.  Coronary Arteries:  Normal coronary origin.  Right dominance.  RCA is a large dominant artery that gives rise to PDA and PLVB. There is no plaque.  Left main is a large artery that gives rise to LAD and LCX arteries. There is mild calcified plaque in the distal LM with associated stenosis of 25-49%.  LAD is a large vessel that gives rise to a large D1 and moderate sized D2 and D3. There is moderate calcified plaque in the ostial and proximal LAD with associated stenosis of 50-69%.  LCX is a non-dominant artery that gives rise to one large OM1 branch. There is no plaque.  Other findings:  Normal pulmonary vein drainage into the left atrium.  Normal let atrial appendage without a thrombus.  Normal size of the pulmonary artery.  IMPRESSION: 1. Coronary calcium score of 321. This was 91st percentile for age and sex matched control.  2.  Normal coronary origin with right dominance.  3.  Moderate atherosclerosis of the LAD.  CAD-RADS 3.  4. Consider symptom guided anti-ischemic and preventive pharmacotherapy as well as risk factor modification per guideline-directed care.  5.  This study has been submitted for FFR analysis.  6.  Aortic atherosclerosis.  Armanda Magic   Electronically Signed By: Armanda Magic On: 08/28/2019  21:18  Narrative EXAM: OVER-READ INTERPRETATION  CT CHEST  The following report is an over-read performed by radiologist Dr. Leanna Battles of [facility] on 08/28/2019. This over-read does not include interpretation of  cardiac or coronary anatomy or pathology. The coronary calcium score/coronary CTA interpretation by the cardiologist is attached.  COMPARISON:  08/30/2010.  FINDINGS: Vascular: Heart is enlarged.  No pericardial effusion.  Mediastinum/Nodes: No pathologically enlarged lymph nodes. Esophagus is grossly unremarkable.  Lungs/Pleura: A subpleural lymph node along the minor fissure (12/7), unchanged. 5 mm peripheral left lower lobe nodule (12/27) also unchanged and benign. Scarring in the lingula.  Upper Abdomen: None.  Musculoskeletal: Degenerative changes in the spine.  IMPRESSION: No acute extracardiac findings.  Electronically Signed: By: Leanna Battles M.D. On: 08/28/2019 14:27          Risk Assessment/Calculations:             Physical Exam:   VS:  BP 128/80   Pulse 74   Ht 5\' 1"  (1.549 m)   Wt 196 lb 6.4 oz (89.1 kg)   SpO2 96%   BMI 37.11 kg/m    Wt Readings from Last 3 Encounters:  01/09/23 196 lb 6.4 oz (89.1 kg)  10/28/21 203 lb 9.6 oz (92.4 kg)  04/29/21 187 lb 12.8 oz (85.2 kg)    GEN: Well nourished, well developed in no acute distress NECK: No JVD; No carotid bruits.  CARDIAC: RRR, no murmurs, rubs, gallops RESPIRATORY:  Clear to auscultation without rales, wheezing or rhonchi  ABDOMEN: Soft, non-tender, non-distended EXTREMITIES:  No edema; No deformity   ASSESSMENT AND PLAN: .   Coronary artery disease-2021 coronary CTA calcium score 321, 91st percentile, moderate atherosclerosis of the LAD, FFR was negative for hemodynamic significance.  Ischemic evaluation in 2022 was negative for ischemia.  Stable with no anginal symptoms. No indication for ischemic evaluation.  Continue Bystolic 10 mg daily, continue  nitroglycerin as needed.  SVT/palpitations -occasional episodes of SVT per prior monitor, currently quiescent, continue Bystolic 10 mg daily. GERD/IBS-upcoming EGD and colonoscopy, she is requesting preoperative evaluation however we not received an official clearance from their office.  Preoperative cardiovascular evaluation-upcoming EGD and colonoscopy. According to the Revised Cardiac Risk Index (RCRI), her Perioperative Risk of Major Cardiac Event is (%): 0.4 Her Functional Capacity in METs is: 5.38 according to the Duke Activity Status Index (DASI). Therefore, based on ACC/AHA guidelines, patient would be at acceptable risk for the planned procedure without further cardiovascular testing. I will route this recommendation to the requesting party via Epic fax function.  Dyslipidemia-most recent LDL was elevated at 106 on 06/28/2022, will repeat LFTs today, if they are okay anticipate starting her on Crestor 20 mg daily.  Will prefer her LDL to be less than 70.        Dispo: CMET, follow up in  6months.   Signed, Flossie Dibble, NP

## 2023-01-09 ENCOUNTER — Encounter: Payer: Self-pay | Admitting: Cardiology

## 2023-01-09 ENCOUNTER — Ambulatory Visit: Payer: Medicare HMO | Attending: Cardiology | Admitting: Cardiology

## 2023-01-09 VITALS — BP 128/80 | HR 74 | Ht 61.0 in | Wt 196.4 lb

## 2023-01-09 DIAGNOSIS — E669 Obesity, unspecified: Secondary | ICD-10-CM

## 2023-01-09 DIAGNOSIS — R002 Palpitations: Secondary | ICD-10-CM | POA: Diagnosis not present

## 2023-01-09 DIAGNOSIS — I2510 Atherosclerotic heart disease of native coronary artery without angina pectoris: Secondary | ICD-10-CM

## 2023-01-09 DIAGNOSIS — I471 Supraventricular tachycardia, unspecified: Secondary | ICD-10-CM | POA: Diagnosis not present

## 2023-01-09 DIAGNOSIS — Z01810 Encounter for preprocedural cardiovascular examination: Secondary | ICD-10-CM | POA: Diagnosis not present

## 2023-01-09 DIAGNOSIS — E782 Mixed hyperlipidemia: Secondary | ICD-10-CM

## 2023-01-09 DIAGNOSIS — Z79899 Other long term (current) drug therapy: Secondary | ICD-10-CM | POA: Diagnosis not present

## 2023-01-09 NOTE — Patient Instructions (Signed)
Medication Instructions:  Your physician recommends that you continue on your current medications as directed. Please refer to the Current Medication list given to you today.  *If you need a refill on your cardiac medications before your next appointment, please call your pharmacy*   Lab Work: Your physician recommends that you return for lab work in: Today for a CMP  If you have labs (blood work) drawn today and your tests are completely normal, you will receive your results only by: MyChart Message (if you have MyChart) OR A paper copy in the mail If you have any lab test that is abnormal or we need to change your treatment, we will call you to review the results.   Testing/Procedures: NONE   Follow-Up: At [facility], you and your health needs are our priority.  As part of our continuing mission to provide you with exceptional heart care, we have created designated Provider Care Teams.  These Care Teams include your primary Cardiologist (physician) and Advanced Practice Providers (APPs -  Physician Assistants and Nurse Practitioners) who all work together to provide you with the care you need, when you need it.  We recommend signing up for the patient portal called "MyChart".  Sign up information is provided on this After Visit Summary.  MyChart is used to connect with patients for Virtual Visits (Telemedicine).  Patients are able to view lab/test results, encounter notes, upcoming appointments, etc.  Non-urgent messages can be sent to your provider as well.   To learn more about what you can do with MyChart, go to [email or web address].    Your next appointment:   6 month(s)  Provider:   Gypsy Balsam, MD   Other Instructions

## 2023-01-10 ENCOUNTER — Telehealth: Payer: Self-pay

## 2023-01-10 LAB — COMPREHENSIVE METABOLIC PANEL WITH GFR
ALT: 26 IU/L (ref 0–32)
AST: 25 IU/L (ref 0–40)
Albumin: 4.1 g/dL (ref 3.9–4.9)
Alkaline Phosphatase: 113 IU/L (ref 44–121)
BUN/Creatinine Ratio: 16 (ref 12–28)
BUN: 13 mg/dL (ref 8–27)
Bilirubin Total: 0.4 mg/dL (ref 0.0–1.2)
CO2: 25 mmol/L (ref 20–29)
Calcium: 9.6 mg/dL (ref 8.7–10.3)
Chloride: 103 mmol/L (ref 96–106)
Creatinine, Ser: 0.79 mg/dL (ref 0.57–1.00)
Globulin, Total: 2.7 g/dL (ref 1.5–4.5)
Glucose: 111 mg/dL — ABNORMAL HIGH (ref 70–99)
Potassium: 4.4 mmol/L (ref 3.5–5.2)
Sodium: 142 mmol/L (ref 134–144)
Total Protein: 6.8 g/dL (ref 6.0–8.5)
eGFR: 80 mL/min/1.73

## 2023-01-10 NOTE — Telephone Encounter (Signed)
Number on file not in service. I mailed a letter requesting a call back.

## 2023-01-10 NOTE — Telephone Encounter (Signed)
-----   Message from Flossie Dibble sent at 01/10/2023  7:23 AM EDT ----- Labs look good.  She needs to be on something for cholesterol, lets start Crestor 10 mg daily. Repeat FLP and LFTs in 8 weeks.

## 2023-01-12 DIAGNOSIS — I10 Essential (primary) hypertension: Secondary | ICD-10-CM | POA: Diagnosis not present

## 2023-01-12 DIAGNOSIS — E785 Hyperlipidemia, unspecified: Secondary | ICD-10-CM | POA: Diagnosis not present

## 2023-01-12 DIAGNOSIS — Z6837 Body mass index (BMI) 37.0-37.9, adult: Secondary | ICD-10-CM | POA: Diagnosis not present

## 2023-01-12 DIAGNOSIS — Z9181 History of falling: Secondary | ICD-10-CM | POA: Diagnosis not present

## 2023-01-12 DIAGNOSIS — E559 Vitamin D deficiency, unspecified: Secondary | ICD-10-CM | POA: Diagnosis not present

## 2023-01-12 DIAGNOSIS — K7581 Nonalcoholic steatohepatitis (NASH): Secondary | ICD-10-CM | POA: Diagnosis not present

## 2023-01-12 DIAGNOSIS — Z79899 Other long term (current) drug therapy: Secondary | ICD-10-CM | POA: Diagnosis not present

## 2023-01-24 ENCOUNTER — Telehealth: Payer: Self-pay

## 2023-01-24 ENCOUNTER — Telehealth: Payer: Self-pay | Admitting: Cardiology

## 2023-01-24 DIAGNOSIS — E782 Mixed hyperlipidemia: Secondary | ICD-10-CM

## 2023-01-24 MED ORDER — ROSUVASTATIN CALCIUM 10 MG PO TABS: 10.0000 mg | ORAL_TABLET | Freq: Every day | ORAL | 3 refills | Status: DC

## 2023-01-24 NOTE — Telephone Encounter (Signed)
Spoke with pt regarding Cholesterol results. She stated that her PCP started her on Rosuvastatin 10mg  after she had the blood work at our office. She stated that she would follow up in 8 weeks to have blood work. She had no further questions.

## 2023-01-24 NOTE — Telephone Encounter (Signed)
Patient called. No answer and no voicemail has been set up.   Orders have been entered for labs and Crestor 10 mg has been sent to only pharmacy on file [facility] on [address].   Tiburcio Pea Prevatt, CMA 01/10/2023 11:53 AM EDT Back to Top    Number on file not in service. I mailed a letter requesting a call back.   Flossie Dibble, NP 01/10/2023  7:23 AM EDT     Labs look good. She needs to be on something for cholesterol, lets start Crestor 10 mg daily. Repeat FLP and LFTs in 8 weeks.

## 2023-01-24 NOTE — Telephone Encounter (Signed)
Pt got a letter regarding her results and was calling back regarding the results. Please advise

## 2023-02-01 NOTE — Telephone Encounter (Signed)
Pt is requesting a callback to see about discussing her medications. She started the Rosuvastatin 10mg  from her PCP but stated she hasn't taken it since 9/6 due to her wanting to speak with her cardiologist first about all of her medications. Please advise

## 2023-02-01 NOTE — Telephone Encounter (Signed)
Spoke with pt. She stated that Dr. Mathis Bud put her on the Rosuvastatin for her cholesterol after seeing Victorino Dike and having blood work. She stated that the Rosuvastatin cause her to have severe leg pain and she stopped it on 01-27-23. She would like for her heart doctor to prescribe her cholesterol medication.

## 2023-02-02 ENCOUNTER — Telehealth: Payer: Self-pay

## 2023-02-02 DIAGNOSIS — F419 Anxiety disorder, unspecified: Secondary | ICD-10-CM | POA: Diagnosis not present

## 2023-02-02 DIAGNOSIS — Z6836 Body mass index (BMI) 36.0-36.9, adult: Secondary | ICD-10-CM | POA: Diagnosis not present

## 2023-02-02 DIAGNOSIS — J309 Allergic rhinitis, unspecified: Secondary | ICD-10-CM | POA: Diagnosis not present

## 2023-02-02 DIAGNOSIS — R600 Localized edema: Secondary | ICD-10-CM | POA: Diagnosis not present

## 2023-02-02 DIAGNOSIS — E782 Mixed hyperlipidemia: Secondary | ICD-10-CM

## 2023-02-02 NOTE — Telephone Encounter (Signed)
Diane Lawrence's note, stop Rosuvastatin start Pravastatin 40mg  every other night or 3 nights a week follow up with Lipid panel, AST, ALT in 6 weeks. Pt agreed and verbalized understanding.

## 2023-02-03 MED ORDER — PRAVASTATIN SODIUM 40 MG PO TABS: ORAL_TABLET | ORAL | 3 refills | Status: DC

## 2023-02-03 NOTE — Addendum Note (Signed)
Addended by: Baldo Ash D on: 02/03/2023 12:58 PM   Modules accepted: Orders

## 2023-02-03 NOTE — Telephone Encounter (Signed)
Patient is following up. She states she called the pharmacy and they do not have the medication. She is getting transportation at 3:00 PM this afternoon and would like to know if it can be sent in before that time.

## 2023-02-16 DIAGNOSIS — Z9181 History of falling: Secondary | ICD-10-CM | POA: Diagnosis not present

## 2023-02-16 DIAGNOSIS — Z1331 Encounter for screening for depression: Secondary | ICD-10-CM | POA: Diagnosis not present

## 2023-02-16 DIAGNOSIS — Z0000 Encounter for general adult medical examination without abnormal findings: Secondary | ICD-10-CM | POA: Diagnosis not present

## 2023-03-01 DIAGNOSIS — F419 Anxiety disorder, unspecified: Secondary | ICD-10-CM | POA: Diagnosis not present

## 2023-03-01 DIAGNOSIS — I10 Essential (primary) hypertension: Secondary | ICD-10-CM | POA: Diagnosis not present

## 2023-03-01 DIAGNOSIS — I2510 Atherosclerotic heart disease of native coronary artery without angina pectoris: Secondary | ICD-10-CM | POA: Diagnosis not present

## 2023-03-01 DIAGNOSIS — K219 Gastro-esophageal reflux disease without esophagitis: Secondary | ICD-10-CM | POA: Diagnosis not present

## 2023-03-01 DIAGNOSIS — Z6836 Body mass index (BMI) 36.0-36.9, adult: Secondary | ICD-10-CM | POA: Diagnosis not present

## 2023-03-06 DIAGNOSIS — M25562 Pain in left knee: Secondary | ICD-10-CM | POA: Diagnosis not present

## 2023-03-15 ENCOUNTER — Telehealth: Payer: Self-pay | Admitting: Cardiology

## 2023-03-15 NOTE — Telephone Encounter (Signed)
Pt c/o medication issue:  1. Name of Medication: pravastatin (PRAVACHOL) 40 MG tablet   2. How are you currently taking this medication (dosage and times per day)?  Take 1 tablet 3 times a week       3. Are you having a reaction (difficulty breathing--STAT)? No  4. What is your medication issue? Pt is requesting a callback regarding this medication not working for her. Please advise

## 2023-03-15 NOTE — Telephone Encounter (Signed)
Pt states that she swelled up with fluid and could not walk. Sx resolved since stopping the medication. Pt has an appt to discuss with Dr. Bing Matter.

## 2023-03-17 ENCOUNTER — Telehealth: Payer: Self-pay | Admitting: Cardiology

## 2023-03-17 DIAGNOSIS — J342 Deviated nasal septum: Secondary | ICD-10-CM | POA: Diagnosis not present

## 2023-03-17 NOTE — Telephone Encounter (Signed)
Pt is requesting a callback regarding her wanting to see about getting a cholesterol medication. Please advise

## 2023-03-17 NOTE — Telephone Encounter (Signed)
Spoke with pt who stated that she wanted an appt to see someone about her cholesterol medications. Her appt is scheduled for 03-21-23 at 10:05 with Wallis Bamberg, NP.

## 2023-03-19 NOTE — Progress Notes (Signed)
 " Cardiology Office Note:  .   Date:  03/21/2023  ID:  Diane Lawrence, DOB May 21, 1951, MRN [id number] PCP: Gable Cambric, MD  [facility] Providers Cardiologist:  Diane Fitch, MD    History of Present Illness: Diane Lawrence is a 72 y.o. female with a past medical history of CAD, GERD, IBS, dyslipidemia.  07/21/2022 echo EF 66 5%, grade 1 DD 08/29/2019 coronary CTA calcium  score 321, 91st percentile, moderate atherosclerosis of the LAD, FFR was negative for hemodynamic significance 08/12/2019 monitor average heart rate 70 bpm, predominant underlying rhythm was sinus, 2 episodes of SVT, no atrial fibrillation, no pauses, 59 triggered events associated with PVCs and PACs  She was evaluated on 01/09/2023, stable from a cardiac perspective, concerns with peritonsillar abscess-does she feel this was related to excessive plaque from her heart, was overall stable from a cardiac perspective and advised to follow-up within 6 months.  She presents today accompanied by family member for follow-up of her cholesterol medication.  She previously been intolerant of Crestor , we are trying her on pravastatin  however she is unable to tolerate this.  She is very concerned about  plaque in her neck, states she underwent MRI and was told that there was plaque in her neck however I do not have access to these charts to review. She denies chest pain, palpitations, dyspnea, pnd, orthopnea, n, v, dizziness, syncope, edema, weight gain, or early satiety.   ROS: Review of Systems  Constitutional: Negative.   HENT: Negative.    Eyes: Negative.   Respiratory: Negative.    Cardiovascular: Negative.   Gastrointestinal: Negative.   Genitourinary: Negative.   Musculoskeletal: Negative.   Skin: Negative.   Neurological: Negative.   Endo/Heme/Allergies: Negative.   Psychiatric/Behavioral: Negative.      Studies Reviewed: .       Cardiac Studies & Procedures     STRESS TESTS  MYOCARDIAL  PERFUSION IMAGING 04/05/2021   ECHOCARDIOGRAM  ECHOCARDIOGRAM COMPLETE 07/21/2022  Narrative ECHOCARDIOGRAM REPORT    Patient Name:   Diane Lawrence Date of Exam: 07/21/2022 Medical Rec #:  [id number]         Height:       61.0 in Accession #:    [id number]        Weight:       203.6 lb Date of Birth:  04-28-1951        BSA:          1.903 m Patient Age:    71 years          BP:           120/72 mmHg Patient Gender: F                 HR:           74 bpm. Exam Location:  [facility]  Procedure: 2D Echo, Cardiac Doppler and Color Doppler  Indications:    Edema, unspecified type [R60.9 (ICD-10-CM)]  History:        Patient has prior history of Echocardiogram examinations, most recent 05/10/2019. CAD; Risk Factors:Dyslipidemia and Hypertension.  Sonographer:    Diane Lawrence RDCS Referring Phys: [id number] Diane Lawrence   Sonographer Comments: Technically difficult study due to poor echo windows. Global longitudinal strain was attempted. PT. refused IV. IMPRESSIONS   1. Left ventricular ejection fraction, by estimation, is 60 to 65%. The left ventricle has normal function. The left ventricle has no regional wall motion abnormalities. Left ventricular diastolic  parameters are consistent with Grade I diastolic dysfunction (impaired relaxation). 2. Right ventricular systolic function is normal. The right ventricular size is normal. 3. The mitral valve is normal in structure. No evidence of mitral valve regurgitation. No evidence of mitral stenosis. 4. The aortic valve is tricuspid. Aortic valve regurgitation is not visualized. No aortic stenosis is present. 5. Aortic Normal DTA. 6. The inferior vena cava is dilated in size with >50% respiratory variability, suggesting right atrial pressure of 8 mmHg.  FINDINGS Left Ventricle: Left ventricular ejection fraction, by estimation, is 60 to 65%. The left ventricle has normal function. The left ventricle has no regional wall motion  abnormalities. The left ventricular internal cavity size was normal in size. There is no left ventricular hypertrophy. Left ventricular diastolic parameters are consistent with Grade I diastolic dysfunction (impaired relaxation). Normal left ventricular filling pressure.  Right Ventricle: The right ventricular size is normal. No increase in right ventricular wall thickness. Right ventricular systolic function is normal.  Left Atrium: Left atrial size was normal in size.  Right Atrium: Right atrial size was normal in size.  Pericardium: There is no evidence of pericardial effusion. Presence of a prominent epicardial fat layer.  Mitral Valve: The mitral valve is normal in structure. No evidence of mitral valve regurgitation. No evidence of mitral valve stenosis.  Tricuspid Valve: The tricuspid valve is normal in structure. Tricuspid valve regurgitation is not demonstrated. No evidence of tricuspid stenosis.  Aortic Valve: The aortic valve is tricuspid. Aortic valve regurgitation is not visualized. No aortic stenosis is present.  Pulmonic Valve: The pulmonic valve was normal in structure. Pulmonic valve regurgitation is mild. No evidence of pulmonic stenosis.  Aorta: Normal DTA, the aortic root and ascending aorta are structurally normal, with no evidence of dilitation and the aortic arch was not well visualized.  Venous: The pulmonary veins were not well visualized. The inferior vena cava is dilated in size with greater than 50% respiratory variability, suggesting right atrial pressure of 8 mmHg.  IAS/Shunts: No atrial level shunt detected by color flow Doppler.   LEFT VENTRICLE PLAX 2D LVIDd:         5.00 cm   Diastology LVIDs:         3.20 cm   LV e' medial:    9.95 cm/s LV PW:         1.00 cm   LV E/e' medial:  9.3 LV IVS:        0.80 cm   LV e' lateral:   8.55 cm/s LVOT diam:     1.80 cm   LV E/e' lateral: 10.9 LV SV:         52 LV SV Index:   27 LVOT Area:     2.54  cm   IVC IVC diam: 2.40 cm  LEFT ATRIUM           Index LA diam:      3.40 cm 1.79 cm/m LA Vol (A4C): 18.1 ml 9.51 ml/m AORTIC VALVE             PULMONIC VALVE LVOT Vmax:   99.20 cm/s  PR End Diast Vel: 3.56 msec LVOT Vmean:  68.500 cm/s LVOT VTI:    0.205 m  AORTA Ao Root diam: 3.40 cm Ao Asc diam:  3.00 cm Ao Desc diam: 2.10 cm  MITRAL VALVE MV Area (PHT): 4.06 cm    SHUNTS MV Decel Time: 187 msec    Systemic VTI:  0.20 m MV E velocity:  92.80 cm/s  Systemic Diam: 1.80 cm MV A velocity: 92.20 cm/s MV E/A ratio:  1.01  Diane Leiter MD Electronically signed by Diane Leiter MD Signature Date/Time: 07/21/2022/5:38:28 PM    Final    MONITORS  LONG TERM MONITOR (3-14 DAYS) 08/09/2019  Narrative The patient wore the monitor for 11 days 6 hours starting 07/19/2019. Indication: Palpitations The minimum heart rate was 45 bpm, maximum heart rate was 145 bpm, and average heart rate was  70 bpm. Predominant underlying rhythm was Sinus Rhythm.  2 Supraventricular Tachycardia runs occurred, the run with the fastest interval lasting 12.8 secs with a max rate of 133 bpm (avg 104 bpm); the run with the fastest interval was also the longest.  Premature atrial complexes were (<1.0%). Premature Ventricular complexes were rare (<1.0%). Ventricular Bigeminy and Trigeminy were present.  No Ventricular tachycardia, No pauses, No AV block and no atrial fibrillation present. 59 patient triggered events noted 15 associated premature ventricular complexes  and 10 associated with premature atrial complexes.   Conclusion: This study is remarkable symptomatic premature ventricular complexes, symptomatic premature atrial complexes and asymptomatic supraventricular tachycardia which is likely paroxysmal atrial tachycardia with variable block.   CT SCANS  CT CORONARY MORPH W/CTA COR W/SCORE 08/28/2019  Addendum 08/28/2019  9:21 PM ADDENDUM REPORT: 08/28/2019 21:18  EXAM: Cardiac/Coronary   CT  TECHNIQUE: The patient was scanned on a [employer].  FINDINGS: A 120 kV prospective scan was triggered in the descending thoracic aorta at 111 HU's. Axial non-contrast 3 mm slices were carried out through the heart. The data set was analyzed on a dedicated work station and scored using the Agatson method. Gantry rotation speed was 250 msecs and collimation was .6 mm. No beta blockade and 0.8 mg of sl NTG was given. The 3D data set was reconstructed in 5% intervals of the 67-82 % of the R-R cycle. Diastolic phases were analyzed on a dedicated work station using MPR, MIP and VRT modes. The patient received 80 cc of contrast.  Aorta:  Normal size.  Scattered calcifications.  No dissection.  Aortic Valve:  Trileaflet.  No calcifications.  Coronary Arteries:  Normal coronary origin.  Right dominance.  RCA is a large dominant artery that gives rise to PDA and PLVB. There is no plaque.  Left main is a large artery that gives rise to LAD and LCX arteries. There is mild calcified plaque in the distal LM with associated stenosis of 25-49%.  LAD is a large vessel that gives rise to a large D1 and moderate sized D2 and D3. There is moderate calcified plaque in the ostial and proximal LAD with associated stenosis of 50-69%.  LCX is a non-dominant artery that gives rise to one large OM1 branch. There is no plaque.  Other findings:  Normal pulmonary vein drainage into the left atrium.  Normal let atrial appendage without a thrombus.  Normal size of the pulmonary artery.  IMPRESSION: 1. Coronary calcium  score of 321. This was 91st percentile for age and sex matched control.  2.  Normal coronary origin with right dominance.  3.  Moderate atherosclerosis of the LAD.  CAD-RADS 3.  4. Consider symptom guided anti-ischemic and preventive pharmacotherapy as well as risk factor modification per guideline-directed care.  5.  This study has been submitted for FFR  analysis.  6.  Aortic atherosclerosis.  Diane Lawrence   Electronically Signed By: Diane Lawrence On: 08/28/2019 21:18  Narrative EXAM: OVER-READ INTERPRETATION  CT CHEST  The following  report is an over-read performed by radiologist Dr. Newell Lawrence of [facility] on 08/28/2019. This over-read does not include interpretation of cardiac or coronary anatomy or pathology. The coronary calcium  score/coronary CTA interpretation by the cardiologist is attached.  COMPARISON:  08/30/2010.  FINDINGS: Vascular: Heart is enlarged.  No pericardial effusion.  Mediastinum/Nodes: No pathologically enlarged lymph nodes. Esophagus is grossly unremarkable.  Lungs/Pleura: A subpleural lymph node along the minor fissure (12/7), unchanged. 5 mm peripheral left lower lobe nodule (12/27) also unchanged and benign. Scarring in the lingula.  Upper Abdomen: None.  Musculoskeletal: Degenerative changes in the spine.  IMPRESSION: No acute extracardiac findings.  Electronically Signed: By: Diane Lawrence M.D. On: 08/28/2019 14:27          Risk Assessment/Calculations:             Physical Exam:   VS:  BP 128/78   Pulse 68   Ht 5' 1 (1.549 m)   Wt 192 lb (87.1 kg)   SpO2 96%   BMI 36.28 kg/m    Wt Readings from Last 3 Encounters:  03/21/23 192 lb (87.1 kg)  01/09/23 196 lb 6.4 oz (89.1 kg)  10/28/21 203 lb 9.6 oz (92.4 kg)    GEN: Well nourished, well developed in no acute distress NECK: No JVD; No carotid bruits.  CARDIAC: RRR, no murmurs, rubs, gallops RESPIRATORY:  Clear to auscultation without rales, wheezing or rhonchi  ABDOMEN: Soft, non-tender, non-distended EXTREMITIES:  No edema; No deformity   ASSESSMENT AND PLAN: .   Coronary artery disease-2021 coronary CTA calcium  score 321, 91st percentile, moderate atherosclerosis of the LAD, FFR was negative for hemodynamic significance.  Ischemic evaluation in 2022 was negative for ischemia.  Stable with no  anginal symptoms. No indication for ischemic evaluation.  Continue Bystolic 10 mg daily, continue nitroglycerin  as needed.  Currently not on aspirin , other.  She was on Plavix in the past.  Will discuss this at her next OV.  SVT/palpitations -occasional episodes of SVT per prior monitor, currently quiescent, continue Bystolic 10 mg daily.  Dyslipidemia-she is intolerant of Crestor  and pravastatin --states she has significant myalgias that make it impossible for her to walk when she takes these.  We discussed different options for lipid-lowering, will refer her to Pharm.D. for PCSK9 inhibitor consideration.  Will check direct LDL and LFTs.  Carotid artery stenosis-she advised she was told she had buildup in her carotid arteries, this is very concerning for her, I do not have any records to review.  Will arrange for carotid duplex.      Dispo: CMET, follow up in  6months.   Signed, Diane JAYSON Hoover, NP   "

## 2023-03-21 ENCOUNTER — Ambulatory Visit: Payer: Medicare HMO | Attending: Cardiology | Admitting: Cardiology

## 2023-03-21 ENCOUNTER — Ambulatory Visit: Payer: Medicare HMO | Admitting: Cardiology

## 2023-03-21 ENCOUNTER — Encounter: Payer: Self-pay | Admitting: Cardiology

## 2023-03-21 VITALS — BP 128/78 | HR 68 | Ht 61.0 in | Wt 192.0 lb

## 2023-03-21 DIAGNOSIS — E782 Mixed hyperlipidemia: Secondary | ICD-10-CM

## 2023-03-21 DIAGNOSIS — R0989 Other specified symptoms and signs involving the circulatory and respiratory systems: Secondary | ICD-10-CM

## 2023-03-21 DIAGNOSIS — I471 Supraventricular tachycardia, unspecified: Secondary | ICD-10-CM

## 2023-03-21 DIAGNOSIS — R002 Palpitations: Secondary | ICD-10-CM

## 2023-03-21 DIAGNOSIS — I2510 Atherosclerotic heart disease of native coronary artery without angina pectoris: Secondary | ICD-10-CM | POA: Diagnosis not present

## 2023-03-21 NOTE — Patient Instructions (Signed)
Medication Instructions:  Your physician recommends that you continue on your current medications as directed. Please refer to the Current Medication list given to you today.  *If you need a refill on your cardiac medications before your next appointment, please call your pharmacy*   Lab Work: Your physician recommends that you return for lab work in: Today for CMP, Direct LDL, LPa  If you have labs (blood work) drawn today and your tests are completely normal, you will receive your results only by: MyChart Message (if you have MyChart) OR A paper copy in the mail If you have any lab test that is abnormal or we need to change your treatment, we will call you to review the results.   Testing/Procedures: Your physician has requested that you have a carotid duplex. This test is an ultrasound of the carotid arteries in your neck. It looks at blood flow through these arteries that supply the brain with blood. Allow one hour for this exam. There are no restrictions or special instructions.    Follow-Up: At [facility], you and your health needs are our priority.  As part of our continuing mission to provide you with exceptional heart care, we have created designated Provider Care Teams.  These Care Teams include your primary Cardiologist (physician) and Advanced Practice Providers (APPs -  Physician Assistants and Nurse Practitioners) who all work together to provide you with the care you need, when you need it.  We recommend signing up for the patient portal called "MyChart".  Sign up information is provided on this After Visit Summary.  MyChart is used to connect with patients for Virtual Visits (Telemedicine).  Patients are able to view lab/test results, encounter notes, upcoming appointments, etc.  Non-urgent messages can be sent to your provider as well.   To learn more about what you can do with MyChart, go to [email or web address].    Your next appointment:   6  month(s)  Provider:   Wallis Bamberg, NP Rosalita Levan)    Other Instructions

## 2023-03-22 LAB — COMPREHENSIVE METABOLIC PANEL WITH GFR
ALT: 19 IU/L (ref 0–32)
AST: 16 IU/L (ref 0–40)
Albumin: 4 g/dL (ref 3.8–4.8)
Alkaline Phosphatase: 118 IU/L (ref 44–121)
BUN/Creatinine Ratio: 22 (ref 12–28)
BUN: 17 mg/dL (ref 8–27)
Bilirubin Total: 0.5 mg/dL (ref 0.0–1.2)
CO2: 28 mmol/L (ref 20–29)
Calcium: 9.6 mg/dL (ref 8.7–10.3)
Chloride: 102 mmol/L (ref 96–106)
Creatinine, Ser: 0.77 mg/dL (ref 0.57–1.00)
Globulin, Total: 2.5 g/dL (ref 1.5–4.5)
Glucose: 106 mg/dL — ABNORMAL HIGH (ref 70–99)
Potassium: 4.6 mmol/L (ref 3.5–5.2)
Sodium: 141 mmol/L (ref 134–144)
Total Protein: 6.5 g/dL (ref 6.0–8.5)
eGFR: 82 mL/min/1.73

## 2023-03-22 LAB — LIPOPROTEIN A (LPA): Lipoprotein (a): 30.5 nmol/L

## 2023-03-22 LAB — LDL CHOLESTEROL, DIRECT: LDL Direct: 131 mg/dL — ABNORMAL HIGH (ref 0–99)

## 2023-03-23 ENCOUNTER — Telehealth: Payer: Self-pay

## 2023-03-23 ENCOUNTER — Telehealth: Payer: Self-pay | Admitting: Cardiology

## 2023-03-23 MED ORDER — ASPIRIN 81 MG PO TBEC: 81.0000 mg | DELAYED_RELEASE_TABLET | Freq: Every day | ORAL | Status: AC

## 2023-03-23 NOTE — Telephone Encounter (Signed)
Spoke with pt regarding lab results per [facility] note regarding cholesterol. Pt verbalized understanding and had no questions. Routed to PCP.

## 2023-03-23 NOTE — Telephone Encounter (Signed)
Added Aspirin to Med list per pt

## 2023-03-23 NOTE — Telephone Encounter (Signed)
Pt states that she wants to inform the person she just spoke with that she is taking the 81 mg Asprin and she wants it added to her current medications list. Please advise

## 2023-04-03 ENCOUNTER — Ambulatory Visit: Payer: Medicare HMO | Admitting: Cardiology

## 2023-04-03 DIAGNOSIS — M7989 Other specified soft tissue disorders: Secondary | ICD-10-CM | POA: Diagnosis not present

## 2023-04-03 DIAGNOSIS — M25462 Effusion, left knee: Secondary | ICD-10-CM | POA: Diagnosis not present

## 2023-04-03 DIAGNOSIS — K7581 Nonalcoholic steatohepatitis (NASH): Secondary | ICD-10-CM | POA: Diagnosis not present

## 2023-04-03 DIAGNOSIS — F32A Depression, unspecified: Secondary | ICD-10-CM | POA: Diagnosis not present

## 2023-04-03 DIAGNOSIS — F419 Anxiety disorder, unspecified: Secondary | ICD-10-CM | POA: Diagnosis not present

## 2023-04-03 DIAGNOSIS — M25562 Pain in left knee: Secondary | ICD-10-CM | POA: Diagnosis not present

## 2023-04-03 DIAGNOSIS — Z6837 Body mass index (BMI) 37.0-37.9, adult: Secondary | ICD-10-CM | POA: Diagnosis not present

## 2023-04-03 DIAGNOSIS — G4730 Sleep apnea, unspecified: Secondary | ICD-10-CM | POA: Diagnosis not present

## 2023-04-12 ENCOUNTER — Ambulatory Visit: Payer: Medicare HMO | Attending: Cardiology

## 2023-04-19 ENCOUNTER — Telehealth: Payer: Self-pay | Admitting: Pharmacy Technician

## 2023-04-19 ENCOUNTER — Telehealth: Payer: Self-pay | Admitting: Pharmacist Clinician (PhC)/ Clinical Pharmacy Specialist

## 2023-04-19 ENCOUNTER — Encounter: Payer: Self-pay | Admitting: Pharmacist Clinician (PhC)/ Clinical Pharmacy Specialist

## 2023-04-19 ENCOUNTER — Ambulatory Visit: Payer: Medicare HMO | Attending: Cardiology | Admitting: Pharmacist Clinician (PhC)/ Clinical Pharmacy Specialist

## 2023-04-19 ENCOUNTER — Other Ambulatory Visit (HOSPITAL_COMMUNITY): Payer: Self-pay

## 2023-04-19 DIAGNOSIS — E782 Mixed hyperlipidemia: Secondary | ICD-10-CM | POA: Diagnosis not present

## 2023-04-19 MED ORDER — NITROGLYCERIN 0.4 MG SL SUBL: 0.4000 mg | SUBLINGUAL_TABLET | SUBLINGUAL | 6 refills | Status: DC | PRN

## 2023-04-19 NOTE — Progress Notes (Signed)
Office Visit    Patient Name: Diane Lawrence Date of Encounter: 04/19/2023  Primary Care Provider:  Lucianne Lei, MD Primary Cardiologist:  Gypsy Balsam, MD  Chief Complaint    Hyperlipidemia   Significant Past Medical History   CAD CAC = 321 (91st percentile), moderate LAD atherosclerosis  angina Refill NTG SL today   GERD Follows with GI, dicyclomine     Allergies  Allergen Reactions   Phenobarbital Shortness Of Breath   Phenobarbital-Belladonna Alk Shortness Of Breath   Amoxicillin Diarrhea   Azithromycin Other (See Comments)    Other reaction(s): GI Upset (intolerance)    Codeine Swelling   Lisinopril Swelling    Heart and stomach hurt   Meperidine Other (See Comments)    Heart failure     Oxycodone Other (See Comments)    Heart failure    Metoprolol Rash and Other (See Comments)    tongue swelling Chest pain   Nitroglycerin Palpitations   Sucralfate Rash    History of Present Illness    Diane Lawrence is a 72 y.o. female patient of Dr Bing Matter, in the office today to discuss options for cholesterol management.  She has previously tried both rosuvastatin and pravastatin, but was not able to tolerate either.    Insurance Carrier:  [employer]  [phone]  LDL Cholesterol goal:  LDL < 70  Current Medications:   none  Previously tried:  rosuvastatin, pravastatin - myalgias, edema  Family Hx:   both parents had DM, mother with heart disease  Social Hx: Tobacco: former smoker quit 2012 Alcohol: no  Diet:    chicken, fish (catfilsh, flounder, salmon) [place]; avoids breakfast meats, fried foods   Accessory Clinical Findings   Lab Results  Component Value Date   LDLDIRECT 131 (H) 03/21/2023    Lipoprotein (a)  Date/Time Value Ref Range Status  03/21/2023 10:36 AM 30.5 <75.0 nmol/L Final    Comment:    Note:  Values greater than or equal to 75.0 nmol/L may        indicate an independent risk factor for CHD,        but  must be evaluated with caution when applied        to non-Caucasian populations due to the        influence of genetic factors on Lp(a) across        ethnicities.     Lab Results  Component Value Date   ALT 19 03/21/2023   AST 16 03/21/2023   ALKPHOS 118 03/21/2023   BILITOT 0.5 03/21/2023   Lab Results  Component Value Date   CREATININE 0.77 03/21/2023   BUN 17 03/21/2023   NA 141 03/21/2023   K 4.6 03/21/2023   CL 102 03/21/2023   CO2 28 03/21/2023   No results found for: "HGBA1C"  Home Medications    Current Outpatient Medications  Medication Sig Dispense Refill   ALPRAZolam (XANAX) 1 MG tablet Take 1 mg by mouth 2 (two) times daily as needed for anxiety.     aspirin EC 81 MG tablet Take 1 tablet (81 mg total) by mouth daily. Swallow whole.     ergocalciferol (VITAMIN D2) 1.25 MG (50000 UT) capsule Take 1 capsule by mouth once a week.     fluticasone (FLONASE) 50 MCG/ACT nasal spray Place 1 spray into both nostrils daily.     lansoprazole (PREVACID) 30 MG capsule Take 30 mg by mouth daily.     nebivolol (BYSTOLIC) 10 MG  tablet Take 10 mg by mouth daily.     nitroGLYCERIN (NITROSTAT) 0.4 MG SL tablet Place 1 tablet (0.4 mg total) under the tongue every 5 (five) minutes as needed for chest pain. 25 tablet 6   dicyclomine (BENTYL) 10 MG capsule Take 10 mg by mouth daily as needed for spasms. (Patient not taking: Reported on 04/19/2023)     montelukast (SINGULAIR) 10 MG tablet Take 10 mg by mouth daily. (Patient not taking: Reported on 04/19/2023)     No current facility-administered medications for this visit.     Assessment & Plan    Mixed dyslipidemia Assessment: Patient with ASCVD not at LDL goal of < 70 Most recent LDL 131 on 03/21/2023 Lp(a) at 30.5 on 03/21/23 Not able to tolerate statins secondary to myalgias - rosuvastatin, pravastatin Reviewed options for lowering LDL cholesterol, including ezetimibe, PCSK-9 inhibitors, bempedoic acid and inclisiran.   Discussed mechanisms of action, dosing, side effects, potential decreases in LDL cholesterol and costs.  Also reviewed potential options for patient assistance.  Plan: Patient agreeable to starting repatha 140 mg q14d Repeat labs after:  3 months Lipid Liver function   Phillips Hay, PharmD CPP [facility] [address]  [place], [place] [address] [phone]  04/19/2023, 11:15 AM

## 2023-04-19 NOTE — Telephone Encounter (Signed)
Pharmacy Patient Advocate Encounter   Received notification from Pt Calls Messages that prior authorization for repatha is required/requested.   Insurance verification completed.   The patient is insured through [employer] .   Per test claim: PA required; PA submitted to above mentioned insurance via CoverMyMeds Key/confirmation #/EOC [id number] Status is pending

## 2023-04-19 NOTE — Assessment & Plan Note (Signed)
Assessment: Patient with ASCVD not at LDL goal of < 70 Most recent LDL 131 on 03/21/2023 Lp(a) at 30.5 on 03/21/23 Not able to tolerate statins secondary to myalgias - rosuvastatin, pravastatin Reviewed options for lowering LDL cholesterol, including ezetimibe, PCSK-9 inhibitors, bempedoic acid and inclisiran.  Discussed mechanisms of action, dosing, side effects, potential decreases in LDL cholesterol and costs.  Also reviewed potential options for patient assistance.  Plan: Patient agreeable to starting repatha 140 mg q14d Repeat labs after:  3 months Lipid Liver function

## 2023-04-19 NOTE — Telephone Encounter (Signed)
Pharmacy Patient Advocate Encounter  Received notification from [facility] that Prior Authorization for repatha has been APPROVED from 05/23/22 to 05/21/24. Ran test claim, Copay is $0.00- one month. This test claim was processed through [facility]- copay amounts may vary at other pharmacies due to pharmacy/plan contracts, or as the patient moves through the different stages of their insurance plan.   PA #/Case ID/Reference #: [id number]

## 2023-04-19 NOTE — Patient Instructions (Signed)
Your Results:             Your most recent labs Goal  LDL (lousy/bad cholesterol 131 < 70   Medication changes:  We will start the process to get Repatha covered by your insurance.  Once the prior authorization is complete, I will call/send a MyChart message to let you know and confirm pharmacy information.   You will take one injection every 14 day  Lab orders:  We want to repeat labs after 2-3 months.  We will send you a lab order to remind you once we get closer to that time.     Thank you for choosing [facility]

## 2023-04-19 NOTE — Telephone Encounter (Signed)
Please do PA for Repatha

## 2023-04-23 MED ORDER — REPATHA SURECLICK 140 MG/ML ~~LOC~~ SOAJ: 140.0000 mg | SUBCUTANEOUS | 3 refills | Status: DC

## 2023-04-23 NOTE — Addendum Note (Signed)
Addended by: Rosalee Kaufman on: 04/23/2023 09:18 AM   Modules accepted: Orders

## 2023-04-28 DIAGNOSIS — M25562 Pain in left knee: Secondary | ICD-10-CM | POA: Diagnosis not present

## 2023-04-28 DIAGNOSIS — M25462 Effusion, left knee: Secondary | ICD-10-CM | POA: Diagnosis not present

## 2023-05-01 DIAGNOSIS — S83272A Complex tear of lateral meniscus, current injury, left knee, initial encounter: Secondary | ICD-10-CM | POA: Diagnosis not present

## 2023-05-03 DIAGNOSIS — F419 Anxiety disorder, unspecified: Secondary | ICD-10-CM | POA: Diagnosis not present

## 2023-05-03 DIAGNOSIS — F32A Depression, unspecified: Secondary | ICD-10-CM | POA: Diagnosis not present

## 2023-05-03 DIAGNOSIS — M159 Polyosteoarthritis, unspecified: Secondary | ICD-10-CM | POA: Diagnosis not present

## 2023-05-03 DIAGNOSIS — E785 Hyperlipidemia, unspecified: Secondary | ICD-10-CM | POA: Diagnosis not present

## 2023-05-25 ENCOUNTER — Telehealth: Payer: Self-pay | Admitting: Cardiology

## 2023-05-25 NOTE — Telephone Encounter (Signed)
 Spoke with pt regarding Repatha  injection. She wasn't sure she got all the medication in. Tried to explain the procedure and encouraged her to come by the office for us  to look at the Repatha  cartridge. She stated that she could not come in. She said she would be in next week and bring the medication with er.

## 2023-05-25 NOTE — Telephone Encounter (Signed)
 Pt c/o medication issue:  1. Name of Medication:  Evolocumab  (REPATHA  SURECLICK) 140 MG/ML SOAJ  2. How are you currently taking this medication (dosage and times per day)?   3. Are you having a reaction (difficulty breathing--STAT)?   4. What is your medication issue?   Patient states she attempted to inject the medication 3 times since yesterday, but she assumes it isn't working. She states she tried to follow the instructions, but it never turns yellow and she doesn't hear a clicking sound. She also mentions that during a previous injection she had bruising at the injection site, but she didn't have any bruising this time. Please advise.

## 2023-05-30 ENCOUNTER — Ambulatory Visit: Payer: Medicare HMO | Attending: Cardiology

## 2023-05-30 DIAGNOSIS — R0989 Other specified symptoms and signs involving the circulatory and respiratory systems: Secondary | ICD-10-CM

## 2023-06-01 DIAGNOSIS — F32A Depression, unspecified: Secondary | ICD-10-CM | POA: Diagnosis not present

## 2023-06-01 DIAGNOSIS — I10 Essential (primary) hypertension: Secondary | ICD-10-CM | POA: Diagnosis not present

## 2023-06-01 DIAGNOSIS — Z6835 Body mass index (BMI) 35.0-35.9, adult: Secondary | ICD-10-CM | POA: Diagnosis not present

## 2023-06-01 DIAGNOSIS — E559 Vitamin D deficiency, unspecified: Secondary | ICD-10-CM | POA: Diagnosis not present

## 2023-06-01 DIAGNOSIS — I2510 Atherosclerotic heart disease of native coronary artery without angina pectoris: Secondary | ICD-10-CM | POA: Diagnosis not present

## 2023-06-01 DIAGNOSIS — K219 Gastro-esophageal reflux disease without esophagitis: Secondary | ICD-10-CM | POA: Diagnosis not present

## 2023-06-01 DIAGNOSIS — R600 Localized edema: Secondary | ICD-10-CM | POA: Diagnosis not present

## 2023-06-01 DIAGNOSIS — F419 Anxiety disorder, unspecified: Secondary | ICD-10-CM | POA: Diagnosis not present

## 2023-06-01 NOTE — Telephone Encounter (Signed)
 Patient is following up regarding having someone assist her with the injection.

## 2023-06-02 NOTE — Telephone Encounter (Signed)
 Spoke with pt and advised not to waste an additional injection and come in Monday for staff to give injection and educate. Advised that there is a website with a video but she does not have the internet. Pt states she will come Monday.

## 2023-06-02 NOTE — Telephone Encounter (Signed)
 Left vm to return call.  [email or web address]

## 2023-06-05 ENCOUNTER — Telehealth: Payer: Self-pay | Admitting: Emergency Medicine

## 2023-06-05 NOTE — Telephone Encounter (Signed)
 Voicemail not set up at this time

## 2023-06-05 NOTE — Telephone Encounter (Signed)
-----   Message from Delon JAYSON Hoover sent at 06/02/2023  8:24 AM EST ----- Carotid ultrasound revealed she has mild plaque in her left ICA, none on the right. This is something we will monitor for now.   Her cholesterol is elevated, managing this helps with the build up in her artery. Has she heart from our pharmacist about starting PCSK9i?

## 2023-06-07 NOTE — Telephone Encounter (Signed)
 Called patient and spoke to her regarding Diane Lawrence note. Per patient she took her first Repatha  shot earlier this week. Results reviewed with pt as per [name] NP's note.  Pt verbalized understanding and had no additional questions.

## 2023-06-12 ENCOUNTER — Telehealth: Payer: Self-pay | Admitting: Cardiology

## 2023-06-12 DIAGNOSIS — M1712 Unilateral primary osteoarthritis, left knee: Secondary | ICD-10-CM | POA: Diagnosis not present

## 2023-06-12 DIAGNOSIS — M25562 Pain in left knee: Secondary | ICD-10-CM | POA: Diagnosis not present

## 2023-06-12 DIAGNOSIS — Z01818 Encounter for other preprocedural examination: Secondary | ICD-10-CM | POA: Diagnosis not present

## 2023-06-12 DIAGNOSIS — M25462 Effusion, left knee: Secondary | ICD-10-CM | POA: Diagnosis not present

## 2023-06-12 NOTE — Telephone Encounter (Signed)
Pt c/o medication issue:  1. Name of Medication: Mobic   2. How are you currently taking this medication (dosage and times per day)? Not Currently taking  3. Are you having a reaction (difficulty breathing--STAT)?   4. What is your medication issue? Dr Carolyne Littles wants to know if patient can take Mobic 7.5 mg daily

## 2023-06-14 NOTE — Telephone Encounter (Addendum)
Called and spoke to patient and advised her of Jimmy Footman note. Patient stated that she probably wouldn't take that medication.  Patient verbalized understanding and had no further questions.

## 2023-06-16 ENCOUNTER — Telehealth: Payer: Self-pay | Admitting: Cardiology

## 2023-06-16 NOTE — Telephone Encounter (Signed)
Patient called to make sure it is ok for her to take her Mobic because she also takes aspirin EC 81 MG tablet

## 2023-06-16 NOTE — Telephone Encounter (Signed)
Spoke with pt. Advised per Dr. Bing Matter that pt could take Mobic while she is taking Aspirin 81mg . Pt verbalized understanding and had no further questions.

## 2023-07-05 DIAGNOSIS — K7581 Nonalcoholic steatohepatitis (NASH): Secondary | ICD-10-CM | POA: Diagnosis not present

## 2023-07-05 DIAGNOSIS — M7989 Other specified soft tissue disorders: Secondary | ICD-10-CM | POA: Diagnosis not present

## 2023-07-05 DIAGNOSIS — Z6837 Body mass index (BMI) 37.0-37.9, adult: Secondary | ICD-10-CM | POA: Diagnosis not present

## 2023-07-05 DIAGNOSIS — E785 Hyperlipidemia, unspecified: Secondary | ICD-10-CM | POA: Diagnosis not present

## 2023-07-05 DIAGNOSIS — F32A Depression, unspecified: Secondary | ICD-10-CM | POA: Diagnosis not present

## 2023-07-05 DIAGNOSIS — F419 Anxiety disorder, unspecified: Secondary | ICD-10-CM | POA: Diagnosis not present

## 2023-07-05 DIAGNOSIS — M159 Polyosteoarthritis, unspecified: Secondary | ICD-10-CM | POA: Diagnosis not present

## 2023-07-05 DIAGNOSIS — G4730 Sleep apnea, unspecified: Secondary | ICD-10-CM | POA: Diagnosis not present

## 2023-08-09 DIAGNOSIS — F419 Anxiety disorder, unspecified: Secondary | ICD-10-CM | POA: Diagnosis not present

## 2023-08-09 DIAGNOSIS — Z6836 Body mass index (BMI) 36.0-36.9, adult: Secondary | ICD-10-CM | POA: Diagnosis not present

## 2023-08-09 DIAGNOSIS — J45909 Unspecified asthma, uncomplicated: Secondary | ICD-10-CM | POA: Diagnosis not present

## 2023-08-09 DIAGNOSIS — F32A Depression, unspecified: Secondary | ICD-10-CM | POA: Diagnosis not present

## 2023-08-09 DIAGNOSIS — E559 Vitamin D deficiency, unspecified: Secondary | ICD-10-CM | POA: Diagnosis not present

## 2023-08-15 DIAGNOSIS — Z79899 Other long term (current) drug therapy: Secondary | ICD-10-CM | POA: Diagnosis not present

## 2023-08-15 DIAGNOSIS — E559 Vitamin D deficiency, unspecified: Secondary | ICD-10-CM | POA: Diagnosis not present

## 2023-08-15 LAB — LAB REPORT - SCANNED
A1c: 5.5
EGFR: 93
TSH: 1.9 (ref 0.41–5.90)

## 2023-08-21 ENCOUNTER — Other Ambulatory Visit: Payer: Self-pay

## 2023-08-21 ENCOUNTER — Telehealth: Payer: Self-pay | Admitting: Cardiology

## 2023-08-21 DIAGNOSIS — R609 Edema, unspecified: Secondary | ICD-10-CM

## 2023-08-21 NOTE — Telephone Encounter (Signed)
 Pt c/o swelling/edema: STAT if pt has developed SOB within 24 hours  If swelling, where is the swelling located? Both ankles  How much weight have you gained and in what time span? 15lbs she is not sure   Have you gained 2 pounds in a day or 5 pounds in a week? Not sure  Do you have a log of your daily weights (if so, list)? no  Are you currently taking a fluid pill? no  Are you currently SOB? no  Have you traveled recently in a car or plane for an extended period of time? no

## 2023-08-21 NOTE — Telephone Encounter (Signed)
 Called patient and she reported that her feet and ankles were swollen. She was asking if the Repatha medication could be causing her lower extremity edema? Spoke with Wallis Bamberg, NP and she stated that the Repatha would not cause the swelling in her lower extremities. She also recommended that she start wearing compression socks and also to have a BMP and Pro BNP lab draw performed to help decide what the next steps are. The patient was informed of Jimmy Footman recommendations. Patient verbalized understanding and had no further questions at this time.

## 2023-08-25 DIAGNOSIS — R609 Edema, unspecified: Secondary | ICD-10-CM | POA: Diagnosis not present

## 2023-08-26 LAB — BASIC METABOLIC PANEL WITH GFR
BUN/Creatinine Ratio: 14 (ref 12–28)
BUN: 9 mg/dL (ref 8–27)
CO2: 22 mmol/L (ref 20–29)
Calcium: 9.3 mg/dL (ref 8.7–10.3)
Chloride: 102 mmol/L (ref 96–106)
Creatinine, Ser: 0.63 mg/dL (ref 0.57–1.00)
Glucose: 102 mg/dL — ABNORMAL HIGH (ref 70–99)
Potassium: 4.3 mmol/L (ref 3.5–5.2)
Sodium: 140 mmol/L (ref 134–144)
eGFR: 95 mL/min/1.73

## 2023-08-26 LAB — PRO B NATRIURETIC PEPTIDE: NT-Pro BNP: 172 pg/mL (ref 0–301)

## 2023-08-28 ENCOUNTER — Telehealth: Payer: Self-pay | Admitting: Cardiology

## 2023-08-28 ENCOUNTER — Other Ambulatory Visit: Payer: Self-pay | Admitting: Pharmacist Clinician (PhC)/ Clinical Pharmacy Specialist

## 2023-08-28 ENCOUNTER — Telehealth: Payer: Self-pay | Admitting: Pharmacist Clinician (PhC)/ Clinical Pharmacy Specialist

## 2023-08-28 DIAGNOSIS — E782 Mixed hyperlipidemia: Secondary | ICD-10-CM

## 2023-08-28 NOTE — Telephone Encounter (Signed)
Patient returned RN's call regarding lab results.

## 2023-08-28 NOTE — Telephone Encounter (Signed)
 Lipid labs ordered, mailed to patient

## 2023-08-28 NOTE — Telephone Encounter (Signed)
 Att x 1. VM not set up

## 2023-08-29 NOTE — Telephone Encounter (Signed)
See previous note on labs.

## 2023-09-05 DIAGNOSIS — I2510 Atherosclerotic heart disease of native coronary artery without angina pectoris: Secondary | ICD-10-CM | POA: Diagnosis not present

## 2023-09-05 DIAGNOSIS — E559 Vitamin D deficiency, unspecified: Secondary | ICD-10-CM | POA: Diagnosis not present

## 2023-09-05 DIAGNOSIS — K219 Gastro-esophageal reflux disease without esophagitis: Secondary | ICD-10-CM | POA: Diagnosis not present

## 2023-09-05 DIAGNOSIS — F419 Anxiety disorder, unspecified: Secondary | ICD-10-CM | POA: Diagnosis not present

## 2023-09-05 DIAGNOSIS — Z6835 Body mass index (BMI) 35.0-35.9, adult: Secondary | ICD-10-CM | POA: Diagnosis not present

## 2023-09-05 DIAGNOSIS — I10 Essential (primary) hypertension: Secondary | ICD-10-CM | POA: Diagnosis not present

## 2023-09-08 DIAGNOSIS — E782 Mixed hyperlipidemia: Secondary | ICD-10-CM | POA: Diagnosis not present

## 2023-09-09 LAB — LIPID PANEL
Chol/HDL Ratio: 1.9 ratio (ref 0.0–4.4)
Cholesterol, Total: 87 mg/dL — ABNORMAL LOW (ref 100–199)
HDL: 45 mg/dL (ref >39)
LDL Chol Calc (NIH): 16 mg/dL (ref 0–99)
Triglycerides: 156 mg/dL — ABNORMAL HIGH (ref 0–149)
VLDL Cholesterol Cal: 26 mg/dL (ref 5–40)

## 2023-09-13 NOTE — Telephone Encounter (Signed)
-----   Message from Ralene Burger sent at 09/11/2023 10:52 AM EDT ----- Cholesterol looks good continue present management

## 2023-09-13 NOTE — Telephone Encounter (Signed)
 Patient notified of results and verbalized understanding.

## 2023-09-16 DIAGNOSIS — M1712 Unilateral primary osteoarthritis, left knee: Secondary | ICD-10-CM | POA: Diagnosis not present

## 2023-09-16 DIAGNOSIS — M25562 Pain in left knee: Secondary | ICD-10-CM | POA: Diagnosis not present

## 2023-10-10 DIAGNOSIS — E785 Hyperlipidemia, unspecified: Secondary | ICD-10-CM | POA: Diagnosis not present

## 2023-10-10 DIAGNOSIS — M159 Polyosteoarthritis, unspecified: Secondary | ICD-10-CM | POA: Diagnosis not present

## 2023-10-10 DIAGNOSIS — K7581 Nonalcoholic steatohepatitis (NASH): Secondary | ICD-10-CM | POA: Diagnosis not present

## 2023-10-10 DIAGNOSIS — G4730 Sleep apnea, unspecified: Secondary | ICD-10-CM | POA: Diagnosis not present

## 2023-10-10 DIAGNOSIS — Z8679 Personal history of other diseases of the circulatory system: Secondary | ICD-10-CM | POA: Diagnosis not present

## 2023-10-10 DIAGNOSIS — M7989 Other specified soft tissue disorders: Secondary | ICD-10-CM | POA: Diagnosis not present

## 2023-10-10 DIAGNOSIS — R002 Palpitations: Secondary | ICD-10-CM | POA: Diagnosis not present

## 2023-10-10 DIAGNOSIS — F419 Anxiety disorder, unspecified: Secondary | ICD-10-CM | POA: Diagnosis not present

## 2023-10-10 DIAGNOSIS — F32A Depression, unspecified: Secondary | ICD-10-CM | POA: Diagnosis not present

## 2023-11-06 DIAGNOSIS — F419 Anxiety disorder, unspecified: Secondary | ICD-10-CM | POA: Diagnosis not present

## 2023-11-06 DIAGNOSIS — F32A Depression, unspecified: Secondary | ICD-10-CM | POA: Diagnosis not present

## 2023-11-06 DIAGNOSIS — J45909 Unspecified asthma, uncomplicated: Secondary | ICD-10-CM | POA: Diagnosis not present

## 2023-11-06 DIAGNOSIS — Z6835 Body mass index (BMI) 35.0-35.9, adult: Secondary | ICD-10-CM | POA: Diagnosis not present

## 2023-11-06 DIAGNOSIS — E559 Vitamin D deficiency, unspecified: Secondary | ICD-10-CM | POA: Diagnosis not present

## 2023-12-13 DIAGNOSIS — I10 Essential (primary) hypertension: Secondary | ICD-10-CM | POA: Diagnosis not present

## 2023-12-13 DIAGNOSIS — J45909 Unspecified asthma, uncomplicated: Secondary | ICD-10-CM | POA: Diagnosis not present

## 2023-12-13 DIAGNOSIS — F419 Anxiety disorder, unspecified: Secondary | ICD-10-CM | POA: Diagnosis not present

## 2023-12-13 DIAGNOSIS — Z6835 Body mass index (BMI) 35.0-35.9, adult: Secondary | ICD-10-CM | POA: Diagnosis not present

## 2023-12-13 DIAGNOSIS — E559 Vitamin D deficiency, unspecified: Secondary | ICD-10-CM | POA: Diagnosis not present

## 2023-12-13 DIAGNOSIS — K219 Gastro-esophageal reflux disease without esophagitis: Secondary | ICD-10-CM | POA: Diagnosis not present

## 2023-12-13 DIAGNOSIS — I2510 Atherosclerotic heart disease of native coronary artery without angina pectoris: Secondary | ICD-10-CM | POA: Diagnosis not present

## 2023-12-13 DIAGNOSIS — F32A Depression, unspecified: Secondary | ICD-10-CM | POA: Diagnosis not present

## 2024-01-11 DIAGNOSIS — E559 Vitamin D deficiency, unspecified: Secondary | ICD-10-CM | POA: Diagnosis not present

## 2024-01-11 DIAGNOSIS — M159 Polyosteoarthritis, unspecified: Secondary | ICD-10-CM | POA: Diagnosis not present

## 2024-01-11 DIAGNOSIS — G4730 Sleep apnea, unspecified: Secondary | ICD-10-CM | POA: Diagnosis not present

## 2024-01-11 DIAGNOSIS — Z8679 Personal history of other diseases of the circulatory system: Secondary | ICD-10-CM | POA: Diagnosis not present

## 2024-01-11 DIAGNOSIS — M7989 Other specified soft tissue disorders: Secondary | ICD-10-CM | POA: Diagnosis not present

## 2024-01-11 DIAGNOSIS — Z79899 Other long term (current) drug therapy: Secondary | ICD-10-CM | POA: Diagnosis not present

## 2024-01-11 DIAGNOSIS — K7581 Nonalcoholic steatohepatitis (NASH): Secondary | ICD-10-CM | POA: Diagnosis not present

## 2024-01-11 DIAGNOSIS — R300 Dysuria: Secondary | ICD-10-CM | POA: Diagnosis not present

## 2024-01-11 DIAGNOSIS — E785 Hyperlipidemia, unspecified: Secondary | ICD-10-CM | POA: Diagnosis not present

## 2024-01-11 DIAGNOSIS — F419 Anxiety disorder, unspecified: Secondary | ICD-10-CM | POA: Diagnosis not present

## 2024-01-11 DIAGNOSIS — F32A Depression, unspecified: Secondary | ICD-10-CM | POA: Diagnosis not present

## 2024-01-28 ENCOUNTER — Other Ambulatory Visit: Payer: Self-pay | Admitting: Cardiology

## 2024-01-31 ENCOUNTER — Ambulatory Visit: Admitting: Cardiology

## 2024-02-05 NOTE — Progress Notes (Unsigned)
 Cardiology Office Note:  .   Date:  02/07/2024  ID:  Diane Lawrence, DOB Dec 22, 1950, MRN [id number] PCP: Gable Cambric, MD  [facility] Providers Cardiologist:  Lamar Fitch, MD    History of Present Illness: Diane Lawrence is a 73 y.o. female with a past medical history of non-obstructive CAD, GERD, IBS, dyslipidemia, mild carotid artery stenosis.  05/30/2023 carotid duplex mild plaque in her left ICA, none in her right 07/21/2022 echo EF 66 5%, grade 1 DD 08/29/2019 coronary CTA calcium  score 321, 91st percentile, moderate atherosclerosis of the LAD, FFR was negative for hemodynamic significance 08/12/2019 monitor average heart rate 70 bpm, predominant underlying rhythm was sinus, 2 episodes of SVT, no atrial fibrillation, no pauses, 59 triggered events associated with PVCs and PACs  She was evaluated on 01/09/2023, stable from a cardiac perspective, concerns with peritonsillar abscess-does she feel this was related to excessive plaque from her heart, was overall stable from a cardiac perspective and advised to follow-up within 6 months.  Evaluated by myself on 03/21/2023,stable from a cardiac perspective we did her and her carotid duplex which revealed mild stenosis on her left ICA and none in the right.  We referred her to Pharm.D. for consideration for PCSK9 inhibitor and she was subsequently started on Repatha  with her most LDL very well-controlled at 25.  She today accompanied by relative for follow-up.  She has been doing well from a cardiac perspective, she has occasional episodes of chest pain, she is not sure if it is related to her heart, or a hiatal hernia she has but on occasion she needs to take nitroglycerin .  We reviewed ED precautions, she is well versed when to present to the ED.  She is most bothered by ongoing left knee pain, considering knee replacement.She denies  palpitations, dyspnea, pnd, orthopnea, n, v, dizziness, syncope, edema, weight gain, or early  satiety.    ROS: Review of Systems  Constitutional: Negative.   HENT: Negative.    Eyes: Negative.   Respiratory: Negative.    Cardiovascular:  Positive for chest pain.  Gastrointestinal: Negative.   Genitourinary: Negative.   Musculoskeletal:  Positive for joint pain.  Skin: Negative.   Neurological: Negative.   Endo/Heme/Allergies: Negative.   Psychiatric/Behavioral: Negative.      Studies Reviewed: SABRA   EKG Interpretation Date/Time:  Wednesday February 07 2024 11:33:22 EDT Ventricular Rate:  69 PR Interval:  166 QRS Duration:  80 QT Interval:  404 QTC Calculation: 432 R Axis:   65  Text Interpretation: Normal sinus rhythm Nonspecific T wave abnormality When compared with ECG of 09-Jan-2023 09:46, Nonspecific T wave abnormality no longer evident in Inferior leads T wave inversion less evident in Anterior leads Confirmed by Carlin Nest [phone]) on 02/07/2024 11:46:49 AM   Cardiac Studies & Procedures   ______________________________________________________________________________________________   STRESS TESTS  MYOCARDIAL PERFUSION IMAGING 04/05/2021   ECHOCARDIOGRAM  ECHOCARDIOGRAM COMPLETE 07/21/2022  Narrative ECHOCARDIOGRAM REPORT    Patient Name:   Diane Lawrence Date of Exam: 07/21/2022 Medical Rec #:  [id number]         Height:       61.0 in Accession #:    [id number]        Weight:       203.6 lb Date of Birth:  1950/10/29        BSA:          1.903 m Patient Age:    37 years  BP:           120/72 mmHg Patient Gender: F                 HR:           74 bpm. Exam Location:  [place]  Procedure: 2D Echo, Cardiac Doppler and Color Doppler  Indications:    Edema, unspecified type [R60.9 (ICD-10-CM)]  History:        Patient has prior history of Echocardiogram examinations, most recent 05/10/2019. CAD; Risk Factors:Dyslipidemia and Hypertension.  Sonographer:    Charlie Jointer RDCS Referring Phys: [id number] LAMAR JINNY FITCH   Sonographer Comments: Technically difficult study due to poor echo windows. Global longitudinal strain was attempted. PT. refused IV. IMPRESSIONS   1. Left ventricular ejection fraction, by estimation, is 60 to 65%. The left ventricle has normal function. The left ventricle has no regional wall motion abnormalities. Left ventricular diastolic parameters are consistent with Grade I diastolic dysfunction (impaired relaxation). 2. Right ventricular systolic function is normal. The right ventricular size is normal. 3. The mitral valve is normal in structure. No evidence of mitral valve regurgitation. No evidence of mitral stenosis. 4. The aortic valve is tricuspid. Aortic valve regurgitation is not visualized. No aortic stenosis is present. 5. Aortic Normal DTA. 6. The inferior vena cava is dilated in size with >50% respiratory variability, suggesting right atrial pressure of 8 mmHg.  FINDINGS Left Ventricle: Left ventricular ejection fraction, by estimation, is 60 to 65%. The left ventricle has normal function. The left ventricle has no regional wall motion abnormalities. The left ventricular internal cavity size was normal in size. There is no left ventricular hypertrophy. Left ventricular diastolic parameters are consistent with Grade I diastolic dysfunction (impaired relaxation). Normal left ventricular filling pressure.  Right Ventricle: The right ventricular size is normal. No increase in right ventricular wall thickness. Right ventricular systolic function is normal.  Left Atrium: Left atrial size was normal in size.  Right Atrium: Right atrial size was normal in size.  Pericardium: There is no evidence of pericardial effusion. Presence of a prominent epicardial fat layer.  Mitral Valve: The mitral valve is normal in structure. No evidence of mitral valve regurgitation. No evidence of mitral valve stenosis.  Tricuspid Valve: The tricuspid valve is normal in structure.  Tricuspid valve regurgitation is not demonstrated. No evidence of tricuspid stenosis.  Aortic Valve: The aortic valve is tricuspid. Aortic valve regurgitation is not visualized. No aortic stenosis is present.  Pulmonic Valve: The pulmonic valve was normal in structure. Pulmonic valve regurgitation is mild. No evidence of pulmonic stenosis.  Aorta: Normal DTA, the aortic root and ascending aorta are structurally normal, with no evidence of dilitation and the aortic arch was not well visualized.  Venous: The pulmonary veins were not well visualized. The inferior vena cava is dilated in size with greater than 50% respiratory variability, suggesting right atrial pressure of 8 mmHg.  IAS/Shunts: No atrial level shunt detected by color flow Doppler.   LEFT VENTRICLE PLAX 2D LVIDd:         5.00 cm   Diastology LVIDs:         3.20 cm   LV e' medial:    9.95 cm/s LV PW:         1.00 cm   LV E/e' medial:  9.3 LV IVS:        0.80 cm   LV e' lateral:   8.55 cm/s LVOT diam:     1.80  cm   LV E/e' lateral: 10.9 LV SV:         52 LV SV Index:   27 LVOT Area:     2.54 cm   IVC IVC diam: 2.40 cm  LEFT ATRIUM           Index LA diam:      3.40 cm 1.79 cm/m LA Vol (A4C): 18.1 ml 9.51 ml/m AORTIC VALVE             PULMONIC VALVE LVOT Vmax:   99.20 cm/s  PR End Diast Vel: 3.56 msec LVOT Vmean:  68.500 cm/s LVOT VTI:    0.205 m  AORTA Ao Root diam: 3.40 cm Ao Asc diam:  3.00 cm Ao Desc diam: 2.10 cm  MITRAL VALVE MV Area (PHT): 4.06 cm    SHUNTS MV Decel Time: 187 msec    Systemic VTI:  0.20 m MV E velocity: 92.80 cm/s  Systemic Diam: 1.80 cm MV A velocity: 92.20 cm/s MV E/A ratio:  1.01  Redell Leiter MD Electronically signed by Redell Leiter MD Signature Date/Time: 07/21/2022/5:38:28 PM    Final    MONITORS  LONG TERM MONITOR (3-14 DAYS) 08/09/2019  Narrative The patient wore the monitor for 11 days 6 hours starting 07/19/2019. Indication: Palpitations The minimum heart rate  was 45 bpm, maximum heart rate was 145 bpm, and average heart rate was  70 bpm. Predominant underlying rhythm was Sinus Rhythm.  2 Supraventricular Tachycardia runs occurred, the run with the fastest interval lasting 12.8 secs with a max rate of 133 bpm (avg 104 bpm); the run with the fastest interval was also the longest.  Premature atrial complexes were (<1.0%). Premature Ventricular complexes were rare (<1.0%). Ventricular Bigeminy and Trigeminy were present.  No Ventricular tachycardia, No pauses, No AV block and no atrial fibrillation present. 59 patient triggered events noted 15 associated premature ventricular complexes  and 10 associated with premature atrial complexes.   Conclusion: This study is remarkable symptomatic premature ventricular complexes, symptomatic premature atrial complexes and asymptomatic supraventricular tachycardia which is likely paroxysmal atrial tachycardia with variable block.   CT SCANS  CT CORONARY FRACTIONAL FLOW RESERVE DATA PREP 08/29/2019  Narrative EXAM: FFRCT ANALYSIS  FINDINGS: FFRct analysis was performed on the original cardiac CT angiogram dataset. Diagrammatic representation of the FFRct analysis is provided in a separate PDF document in PACS. This dictation was created using the PDF document and an interactive 3D model of the results. 3D model is not available in the EMR/PACS. Normal FFR range is >0.80.  1. Left Main: No significant stenosis. FFR = 0.98.  2. LAD: No significant stenosis. Proximal FFR = 0.95, Mid FFR = 0.93, Distal FFR = 0.88.  3. LCX: No significant stenosis. Proximal FFR = 0.96, Mid FFR = 0.93, Distal FFR = 0.87.  4. Ramus: No significant stenosis. Proximal FFR = 0.97, Mid FFR = 0.95, Distal FFR = 0.93.  5. RCA: No significant stenosis. Proximal FFR = 0.98, Mid FFR = 0.90, Distal FFR = 0.80.  IMPRESSION: 1. CT FFR analysis demonstrates no hemodynamically flow limiting lesions.  Wilbert  Turner   Electronically Signed By: Wilbert Bihari On: 09/01/2019 14:00   CT CORONARY MORPH W/CTA COR W/SCORE 08/28/2019  Addendum 08/28/2019  9:21 PM ADDENDUM REPORT: 08/28/2019 21:18  EXAM: Cardiac/Coronary  CT  TECHNIQUE: The patient was scanned on a [employer].  FINDINGS: A 120 kV prospective scan was triggered in the descending thoracic aorta at 111 HU's. Axial non-contrast 3  mm slices were carried out through the heart. The data set was analyzed on a dedicated work station and scored using the Agatson method. Gantry rotation speed was 250 msecs and collimation was .6 mm. No beta blockade and 0.8 mg of sl NTG was given. The 3D data set was reconstructed in 5% intervals of the 67-82 % of the R-R cycle. Diastolic phases were analyzed on a dedicated work station using MPR, MIP and VRT modes. The patient received 80 cc of contrast.  Aorta:  Normal size.  Scattered calcifications.  No dissection.  Aortic Valve:  Trileaflet.  No calcifications.  Coronary Arteries:  Normal coronary origin.  Right dominance.  RCA is a large dominant artery that gives rise to PDA and PLVB. There is no plaque.  Left main is a large artery that gives rise to LAD and LCX arteries. There is mild calcified plaque in the distal LM with associated stenosis of 25-49%.  LAD is a large vessel that gives rise to a large D1 and moderate sized D2 and D3. There is moderate calcified plaque in the ostial and proximal LAD with associated stenosis of 50-69%.  LCX is a non-dominant artery that gives rise to one large OM1 branch. There is no plaque.  Other findings:  Normal pulmonary vein drainage into the left atrium.  Normal let atrial appendage without a thrombus.  Normal size of the pulmonary artery.  IMPRESSION: 1. Coronary calcium  score of 321. This was 91st percentile for age and sex matched control.  2.  Normal coronary origin with right dominance.  3.  Moderate atherosclerosis  of the LAD.  CAD-RADS 3.  4. Consider symptom guided anti-ischemic and preventive pharmacotherapy as well as risk factor modification per guideline-directed care.  5.  This study has been submitted for FFR analysis.  6.  Aortic atherosclerosis.  Wilbert Bihari   Electronically Signed By: Wilbert Bihari On: 08/28/2019 21:18  Narrative EXAM: OVER-READ INTERPRETATION  CT CHEST  The following report is an over-read performed by radiologist Dr. Newell Eke of [facility] on 08/28/2019. This over-read does not include interpretation of cardiac or coronary anatomy or pathology. The coronary calcium  score/coronary CTA interpretation by the cardiologist is attached.  COMPARISON:  08/30/2010.  FINDINGS: Vascular: Heart is enlarged.  No pericardial effusion.  Mediastinum/Nodes: No pathologically enlarged lymph nodes. Esophagus is grossly unremarkable.  Lungs/Pleura: A subpleural lymph node along the minor fissure (12/7), unchanged. 5 mm peripheral left lower lobe nodule (12/27) also unchanged and benign. Scarring in the lingula.  Upper Abdomen: None.  Musculoskeletal: Degenerative changes in the spine.  IMPRESSION: No acute extracardiac findings.  Electronically Signed: By: Newell Eke M.D. On: 08/28/2019 14:27     ______________________________________________________________________________________________      Risk Assessment/Calculations:             Physical Exam:   VS:  BP 138/70   Pulse 69   Ht 5' 1 (1.549 m)   Wt 193 lb (87.5 kg)   BMI 36.47 kg/m    Wt Readings from Last 3 Encounters:  02/07/24 193 lb (87.5 kg)  03/21/23 192 lb (87.1 kg)  01/09/23 196 lb 6.4 oz (89.1 kg)    GEN: Well nourished, well developed in no acute distress NECK: No JVD; No carotid bruits.  CARDIAC: RRR, no murmurs, rubs, gallops RESPIRATORY:  Clear to auscultation without rales, wheezing or rhonchi  ABDOMEN: Soft, non-tender, non-distended EXTREMITIES:  No  edema; No deformity   ASSESSMENT AND PLAN: .   Coronary  artery disease-2021 coronary CTA calcium  score 321, 91st percentile, moderate atherosclerosis of the LAD, FFR was negative for hemodynamic significance.  Ischemic evaluation in 2022 was negative for ischemia.  She does have a cardiac in nature but she does state) and it seems to relieve this, I do not think we need an ischemic evaluation at this time.  Continue Bystolic 10 mg daily, nitroglycerin  as needed, aspirin  81 mg daily, Repatha .  SVT/palpitations -occasional episodes of SVT per prior monitor, currently quiescent, continue Bystolic 10 mg daily.      Dyslipidemia-she is intolerant of statin injections in the skin every 2 weeks, most recent LDL was excellently controlled at 25.  Carotid artery stenosis-mild stenosis in her left ICA, none noted in her right, currently on aspirin  as well as Repatha .    Dispo: Follow-up in 6 months.  Signed, Delon JAYSON Hoover, NP

## 2024-02-07 ENCOUNTER — Ambulatory Visit: Attending: Cardiology | Admitting: Cardiology

## 2024-02-07 ENCOUNTER — Encounter: Payer: Self-pay | Admitting: Cardiology

## 2024-02-07 VITALS — BP 138/70 | HR 69 | Ht 61.0 in | Wt 193.0 lb

## 2024-02-07 DIAGNOSIS — I471 Supraventricular tachycardia, unspecified: Secondary | ICD-10-CM | POA: Diagnosis not present

## 2024-02-07 DIAGNOSIS — E782 Mixed hyperlipidemia: Secondary | ICD-10-CM | POA: Diagnosis not present

## 2024-02-07 DIAGNOSIS — I6522 Occlusion and stenosis of left carotid artery: Secondary | ICD-10-CM

## 2024-02-07 DIAGNOSIS — I2510 Atherosclerotic heart disease of native coronary artery without angina pectoris: Secondary | ICD-10-CM | POA: Diagnosis not present

## 2024-02-07 MED ORDER — NITROGLYCERIN 0.4 MG SL SUBL: 0.4000 mg | SUBLINGUAL_TABLET | SUBLINGUAL | 6 refills | Status: AC | PRN

## 2024-02-07 NOTE — Patient Instructions (Signed)
 Medication Instructions:   No changes   *If you need a refill on your cardiac medications before your next appointment, please call your pharmacy*  Lab Work:  None today   If you have labs (blood work) drawn today and your tests are completely normal, you will receive your results only by: MyChart Message (if you have MyChart) OR A paper copy in the mail If you have any lab test that is abnormal or we need to change your treatment, we will call you to review the results.  Testing/Procedures: Follow-Up: At [facility], you and your health needs are our priority.  As part of our continuing mission to provide you with exceptional heart care, our providers are all part of one team.  This team includes your primary Cardiologist (physician) and Advanced Practice Providers or APPs (Physician Assistants and Nurse Practitioners) who all work together to provide you with the care you need, when you need it.  Your next appointment:   6 month(s)  Provider:   Krasowski  We recommend signing up for the patient portal called MyChart.  Sign up information is provided on this After Visit Summary.  MyChart is used to connect with patients for Virtual Visits (Telemedicine).  Patients are able to view lab/test results, encounter notes, upcoming appointments, etc.  Non-urgent messages can be sent to your provider as well.   To learn more about what you can do with MyChart, go to [email or web address].   Other Instructions  Follow up in 6 months.

## 2024-02-12 DIAGNOSIS — R252 Cramp and spasm: Secondary | ICD-10-CM | POA: Diagnosis not present

## 2024-02-12 DIAGNOSIS — F419 Anxiety disorder, unspecified: Secondary | ICD-10-CM | POA: Diagnosis not present

## 2024-02-12 DIAGNOSIS — J45909 Unspecified asthma, uncomplicated: Secondary | ICD-10-CM | POA: Diagnosis not present

## 2024-02-12 DIAGNOSIS — F32A Depression, unspecified: Secondary | ICD-10-CM | POA: Diagnosis not present

## 2024-02-12 DIAGNOSIS — E559 Vitamin D deficiency, unspecified: Secondary | ICD-10-CM | POA: Diagnosis not present

## 2024-02-12 DIAGNOSIS — K589 Irritable bowel syndrome without diarrhea: Secondary | ICD-10-CM | POA: Diagnosis not present

## 2024-02-12 DIAGNOSIS — M1990 Unspecified osteoarthritis, unspecified site: Secondary | ICD-10-CM | POA: Diagnosis not present

## 2024-02-12 DIAGNOSIS — Z6835 Body mass index (BMI) 35.0-35.9, adult: Secondary | ICD-10-CM | POA: Diagnosis not present

## 2024-02-23 ENCOUNTER — Telehealth: Payer: Self-pay | Admitting: Cardiology

## 2024-02-23 NOTE — Telephone Encounter (Signed)
 Pt c/o medication issue:  1. Name of Medication:   Evolocumab  (REPATHA  SURECLICK) 140 MG/ML SOAJ    2. How are you currently taking this medication (dosage and times per day)?  As prescribed  3. Are you having a reaction (difficulty breathing--STAT)?   4. What is your medication issue?   Patient says the medication is causing spasms in legs that have spread to her liver. Please advise.

## 2024-02-27 NOTE — Telephone Encounter (Signed)
 Pt is caling to f/u on her call from Friday. The medication is still causing severe muscle cramps and spasms. She would like a call back to discuss. Please advise.

## 2024-02-28 NOTE — Telephone Encounter (Signed)
No vm or answer. 

## 2024-02-29 NOTE — Telephone Encounter (Signed)
 Pt states that she did not take the dose for October and her sx resolved. Pt would like to know what to do next. Please let pt know as she is seen by the lipid clinic.

## 2024-03-04 NOTE — Telephone Encounter (Signed)
 Patient scheduled for 10/29 in [place]

## 2024-03-04 NOTE — Telephone Encounter (Signed)
 Called to LVM. Can offer patient appointment in [place] with PharmD to discuss alternatives.

## 2024-03-19 NOTE — Progress Notes (Unsigned)
 Office Visit    Patient Name: Diane Lawrence Date of Encounter: 03/19/2024  Primary Care Provider:  Gable Cambric, MD Primary Cardiologist:  Lamar Fitch, MD  Chief Complaint    Hyperlipidemia   Significant Past Medical History   Nonobstructive CAD CAC score 321, 91st percentile  Carotid artery stenosis Mild stenosis in left ICA     Allergies  Allergen Reactions   Phenobarbital Shortness Of Breath   Phenobarbital-Belladonna Alk Shortness Of Breath   Amoxicillin Diarrhea   Azithromycin Other (See Comments)    Other reaction(s): GI Upset (intolerance)    Codeine Swelling   Lisinopril  Swelling    Heart and stomach hurt   Meperidine Other (See Comments)    Heart failure     Oxycodone Other (See Comments)    Heart failure    Metoprolol  Rash and Other (See Comments)    tongue swelling Chest pain   Nitroglycerin  Palpitations   Sucralfate Rash    History of Present Illness    Diane Lawrence is a 73 y.o. female patient of Dr Fitch, in the office today to discuss options for cholesterol management.   Patient was seen by Dr. Herschel last year in November. During this appointment patient was counseled on Repatha  and started the medication shortly after. The patient noticed muscle pain within one month of using Repatha  and tried to tolerate the medication for about 10 months until she could not anymore. She presents today to discuss options for cholesterol management and clearly stated she does not want to restart Repatha .  Her last dose was about 3-4 weeks ago.    Insurance Carrier: [employer] - Medicare/Medicaid  Pharmacy:   [facility] - [address]    [facility]:      LDL Cholesterol goal:  Most recent LDL is 16 mg/dL, goal is <29 mg/dL  Current Medications:   none  Previously tried:  pravastatin  and rosuvastatin  - myalgia, Repatha  - muscle cramps and spasm  Family Hx:    Mother (Deceased) Diabetes   Heart disease     Father (Deceased) Diabetes    Skin cancer      Social Hx: Tobacco: former smoker quit 2012 Alcohol:    none  Diet:    Patient eat 2 to 3 meals a day, mainly eats chicken, turkey, and fish with plenty of vegetables. Will have 2-3 hotdogs with chili and cheese weekly. Recently started eating bananas.   Exercise: tries to walk daily, limited by knee pain  Accessory Clinical Findings   Lab Results  Component Value Date   CHOL 87 (L) 09/08/2023   HDL 45 09/08/2023   LDLCALC 16 09/08/2023   LDLDIRECT 131 (H) 03/21/2023   TRIG 156 (H) 09/08/2023   CHOLHDL 1.9 09/08/2023    Lipoprotein (a)  Date/Time Value Ref Range Status  03/21/2023 10:36 AM 30.5 <75.0 nmol/L Final    Comment:    Note:  Values greater than or equal to 75.0 nmol/L may        indicate an independent risk factor for CHD,        but must be evaluated with caution when applied        to non-Caucasian populations due to the        influence of genetic factors on Lp(a) across        ethnicities.     Lab Results  Component Value Date   ALT 19 03/21/2023   AST 16 03/21/2023   ALKPHOS 118 03/21/2023   BILITOT 0.5 03/21/2023  Lab Results  Component Value Date   CREATININE 0.63 08/25/2023   BUN 9 08/25/2023   NA 140 08/25/2023   K 4.3 08/25/2023   CL 102 08/25/2023   CO2 22 08/25/2023   No results found for: HGBA1C  Home Medications    Current Outpatient Medications  Medication Sig Dispense Refill   albuterol  (VENTOLIN  HFA) 108 (90 Base) MCG/ACT inhaler Inhale 2 puffs into the lungs every 6 (six) hours as needed for wheezing or shortness of breath.     ALPRAZolam (XANAX) 1 MG tablet Take 1 mg by mouth 2 (two) times daily as needed for anxiety.     aspirin  EC 81 MG tablet Take 1 tablet (81 mg total) by mouth daily. Swallow whole.     ergocalciferol (VITAMIN D2) 1.25 MG (50000 UT) capsule Take 1 capsule by mouth once a week.     Evolocumab  (REPATHA  SURECLICK) 140 MG/ML SOAJ Inject 140 mg into the skin every 14 (fourteen) days. 6 mL 3    fluticasone (FLONASE) 50 MCG/ACT nasal spray Place 1 spray into both nostrils daily.     lansoprazole (PREVACID) 30 MG capsule Take 30 mg by mouth daily.     montelukast (SINGULAIR) 10 MG tablet Take 10 mg by mouth daily.     nebivolol (BYSTOLIC) 10 MG tablet Take 10 mg by mouth daily.     nitroGLYCERIN  (NITROSTAT ) 0.4 MG SL tablet Place 1 tablet (0.4 mg total) under the tongue every 5 (five) minutes as needed for chest pain. 25 tablet 6   QVAR REDIHALER 80 MCG/ACT inhaler Inhale 2 puffs into the lungs 2 (two) times daily.     No current facility-administered medications for this visit.     Assessment & Plan    Mixed Dyslipidemia  Assessment:  - Most recent LDL is 16 mg/dL, goal is <29 mg/dL  - Did not tolerate rosuvastatin , pravastatin , and Repatha . Caused myalgia with statin and muscle cramps with Repatha . - Reviewed other possible option to help control cholesterol, siRNA (Leqvio). We reviewed the MOA, adverse effects, and cost of medication. Also reviewed possible option to help with cost of medication.  - Addressed all question the patient had  Plan:  - Will apply for Leqvio and will reach out to patient about insurance coverage.  - Will check lipids again 4 months after starting Leqvio.   Shirlee GORMAN Langdon, PharmD Student [address]   [place], [place] [address] [phone]  03/19/2024, 9:49 PM  I was with student and patient for entire visit and agree with above assessment and plan.   Tyran Huser PharmD [facility]

## 2024-03-20 ENCOUNTER — Encounter: Payer: Self-pay | Admitting: Pharmacist Clinician (PhC)/ Clinical Pharmacy Specialist

## 2024-03-20 ENCOUNTER — Ambulatory Visit: Attending: Cardiology | Admitting: Pharmacist Clinician (PhC)/ Clinical Pharmacy Specialist

## 2024-03-20 DIAGNOSIS — E782 Mixed hyperlipidemia: Secondary | ICD-10-CM | POA: Diagnosis not present

## 2024-03-20 NOTE — Patient Instructions (Signed)
 Your Results:             Your most recent labs Goal  Total Cholesterol 87 < 200  Triglycerides 156 < 150  HDL (happy/good cholesterol) 45 > 40  LDL (lousy/bad cholesterol 16 < <70   Medication changes:  We will start the process to get Leqvio covered by your insurance.  Once the prior authorization is complete, I will call/send a MyChart message to let you know and confirm pharmacy information.   You will take the first two doses 3 months then a dose every 6 months after that.  We will arrange to get these done at the infusion center here in [place]  Lab orders:  We want to repeat labs after 4 months.  We will send you a lab order to remind you once we get closer to that time.     Thank you for choosing [facility]

## 2024-03-28 DIAGNOSIS — K219 Gastro-esophageal reflux disease without esophagitis: Secondary | ICD-10-CM | POA: Diagnosis not present

## 2024-03-28 DIAGNOSIS — I2510 Atherosclerotic heart disease of native coronary artery without angina pectoris: Secondary | ICD-10-CM | POA: Diagnosis not present

## 2024-03-28 DIAGNOSIS — F419 Anxiety disorder, unspecified: Secondary | ICD-10-CM | POA: Diagnosis not present

## 2024-03-28 DIAGNOSIS — F32A Depression, unspecified: Secondary | ICD-10-CM | POA: Diagnosis not present

## 2024-03-28 DIAGNOSIS — E559 Vitamin D deficiency, unspecified: Secondary | ICD-10-CM | POA: Diagnosis not present

## 2024-03-28 DIAGNOSIS — I10 Essential (primary) hypertension: Secondary | ICD-10-CM | POA: Diagnosis not present

## 2024-03-28 DIAGNOSIS — Z6837 Body mass index (BMI) 37.0-37.9, adult: Secondary | ICD-10-CM | POA: Diagnosis not present

## 2024-03-28 DIAGNOSIS — M1990 Unspecified osteoarthritis, unspecified site: Secondary | ICD-10-CM | POA: Diagnosis not present

## 2024-03-29 ENCOUNTER — Other Ambulatory Visit: Payer: Self-pay | Admitting: Cardiology

## 2024-03-29 DIAGNOSIS — E782 Mixed hyperlipidemia: Secondary | ICD-10-CM

## 2024-03-29 DIAGNOSIS — I2510 Atherosclerotic heart disease of native coronary artery without angina pectoris: Secondary | ICD-10-CM

## 2024-04-02 ENCOUNTER — Other Ambulatory Visit: Payer: Self-pay | Admitting: Pharmacist Clinician (PhC)/ Clinical Pharmacy Specialist

## 2024-04-05 ENCOUNTER — Telehealth: Payer: Self-pay

## 2024-04-05 ENCOUNTER — Other Ambulatory Visit (HOSPITAL_COMMUNITY): Payer: Self-pay | Admitting: Cardiology

## 2024-04-05 NOTE — Telephone Encounter (Signed)
 Dr. Krasowski and Kristin, patient will be scheduled as soon as possible.  Auth Submission: APPROVED Site of care: Site of care: [facility] Payer: [employer] medicare Medication & CPT/J Code(s) submitted: Leqvio (Inclisiran) [id number] Diagnosis Code:  Route of submission (phone, fax, portal): portal Phone # Fax # Auth type: Buy/Bill PB Units/visits requested: 284mg  x 3 doses Reference number: [id number] Approval from: 04/04/24 to 05/22/25

## 2024-04-09 ENCOUNTER — Ambulatory Visit

## 2024-04-22 ENCOUNTER — Telehealth: Payer: Self-pay | Admitting: Cardiology

## 2024-04-22 NOTE — Telephone Encounter (Signed)
 Pt states that she is returning a call that she received from our office. Pt says that she think it may be regarding injection. Please advise

## 2024-04-30 ENCOUNTER — Telehealth: Payer: Self-pay | Admitting: Cardiology

## 2024-04-30 NOTE — Telephone Encounter (Signed)
 Pt called in regarding a new medication she was supposed to start. Please advise. Pt referrs to be contacted after 2.

## 2024-04-30 NOTE — Telephone Encounter (Signed)
 Called this patient and she was asking about when to start her new medication Garr)? I saw that it had been approved but the patient was wanting to know when she should start taking this medication. She mentioned that the last shot she received was on 02/20/24, not sure if that was Repatha  or not. Patient was very unclear about the information that was provided. Please advise.

## 2024-05-20 NOTE — Telephone Encounter (Signed)
Thanks Krista.

## 2024-05-20 NOTE — Telephone Encounter (Signed)
 This patient wants to go to [place]. We have tried to schedule her here but she states she can not drive to [place] or get anyone to take her.

## 2024-05-20 NOTE — Telephone Encounter (Signed)
 Yes, we have told her this multiple times and that it would be after the 1st of the year. She was told she would have to wait until credentials were completed but she still refused to come to [place].
